# Patient Record
Sex: Female | Born: 1965 | Race: White | Hispanic: No | Marital: Married | Smoking: Never smoker
Health system: Southern US, Community
[De-identification: ages and names within clinical notes are randomized; demographics above are authoritative.]

## PROBLEM LIST (undated history)

## (undated) DIAGNOSIS — N870 Mild cervical dysplasia: Secondary | ICD-10-CM

## (undated) DIAGNOSIS — Z803 Family history of malignant neoplasm of breast: Secondary | ICD-10-CM

## (undated) DIAGNOSIS — N926 Irregular menstruation, unspecified: Secondary | ICD-10-CM

## (undated) DIAGNOSIS — D6851 Activated protein C resistance: Secondary | ICD-10-CM

## (undated) DIAGNOSIS — R896 Abnormal cytological findings in specimens from other organs, systems and tissues: Secondary | ICD-10-CM

## (undated) DIAGNOSIS — R102 Pelvic and perineal pain: Secondary | ICD-10-CM

## (undated) DIAGNOSIS — IMO0002 Reserved for concepts with insufficient information to code with codable children: Secondary | ICD-10-CM

## (undated) DIAGNOSIS — C50919 Malignant neoplasm of unspecified site of unspecified female breast: Secondary | ICD-10-CM

## (undated) DIAGNOSIS — R87619 Unspecified abnormal cytological findings in specimens from cervix uteri: Secondary | ICD-10-CM

## (undated) HISTORY — DX: Mild cervical dysplasia: N87.0

## (undated) HISTORY — DX: Unspecified abnormal cytological findings in specimens from cervix uteri: R87.619

## (undated) HISTORY — DX: Irregular menstruation, unspecified: N92.6

## (undated) HISTORY — DX: Pelvic and perineal pain: R10.2

## (undated) HISTORY — DX: Family history of malignant neoplasm of breast: Z80.3

## (undated) HISTORY — DX: Abnormal cytological findings in specimens from other organs, systems and tissues: R89.6

## (undated) HISTORY — DX: Reserved for concepts with insufficient information to code with codable children: IMO0002

## (undated) HISTORY — DX: Malignant neoplasm of unspecified site of unspecified female breast: C50.919

## (undated) HISTORY — DX: Activated protein C resistance: D68.51

---

## 1990-07-10 HISTORY — PX: LEEP: SHX91

## 1997-08-10 DIAGNOSIS — IMO0001 Reserved for inherently not codable concepts without codable children: Secondary | ICD-10-CM

## 1997-08-10 HISTORY — DX: Reserved for inherently not codable concepts without codable children: IMO0001

## 1998-03-18 ENCOUNTER — Ambulatory Visit (HOSPITAL_COMMUNITY): Admission: RE | Admit: 1998-03-18 | Discharge: 1998-03-18 | Payer: Self-pay | Admitting: Obstetrics and Gynecology

## 1998-04-12 ENCOUNTER — Inpatient Hospital Stay (HOSPITAL_COMMUNITY): Admission: AD | Admit: 1998-04-12 | Discharge: 1998-04-14 | Payer: Self-pay | Admitting: Obstetrics and Gynecology

## 1998-05-22 ENCOUNTER — Encounter (HOSPITAL_COMMUNITY): Admission: RE | Admit: 1998-05-22 | Discharge: 1998-06-28 | Payer: Self-pay | Admitting: Obstetrics and Gynecology

## 1998-05-26 ENCOUNTER — Inpatient Hospital Stay (HOSPITAL_COMMUNITY): Admission: AD | Admit: 1998-05-26 | Discharge: 1998-05-29 | Payer: Self-pay | Admitting: Obstetrics and Gynecology

## 1998-06-28 ENCOUNTER — Inpatient Hospital Stay (HOSPITAL_COMMUNITY): Admission: AD | Admit: 1998-06-28 | Discharge: 1998-07-07 | Payer: Self-pay | Admitting: Obstetrics and Gynecology

## 1998-07-07 ENCOUNTER — Encounter (HOSPITAL_COMMUNITY): Admission: RE | Admit: 1998-07-07 | Discharge: 1998-10-05 | Payer: Self-pay | Admitting: Obstetrics and Gynecology

## 1998-11-23 ENCOUNTER — Other Ambulatory Visit: Admission: RE | Admit: 1998-11-23 | Discharge: 1998-11-23 | Payer: Self-pay | Admitting: Obstetrics and Gynecology

## 1999-12-14 ENCOUNTER — Other Ambulatory Visit: Admission: RE | Admit: 1999-12-14 | Discharge: 1999-12-14 | Payer: Self-pay | Admitting: Obstetrics and Gynecology

## 2001-01-07 DIAGNOSIS — N926 Irregular menstruation, unspecified: Secondary | ICD-10-CM

## 2001-01-07 HISTORY — DX: Irregular menstruation, unspecified: N92.6

## 2001-01-23 ENCOUNTER — Other Ambulatory Visit: Admission: RE | Admit: 2001-01-23 | Discharge: 2001-01-23 | Payer: Self-pay | Admitting: Obstetrics and Gynecology

## 2001-02-05 ENCOUNTER — Ambulatory Visit (HOSPITAL_COMMUNITY): Admission: RE | Admit: 2001-02-05 | Discharge: 2001-02-05 | Payer: Self-pay | Admitting: Obstetrics and Gynecology

## 2001-02-05 ENCOUNTER — Encounter: Payer: Self-pay | Admitting: Obstetrics and Gynecology

## 2002-01-23 ENCOUNTER — Other Ambulatory Visit: Admission: RE | Admit: 2002-01-23 | Discharge: 2002-01-23 | Payer: Self-pay | Admitting: Obstetrics and Gynecology

## 2003-02-05 ENCOUNTER — Other Ambulatory Visit: Admission: RE | Admit: 2003-02-05 | Discharge: 2003-02-05 | Payer: Self-pay | Admitting: Obstetrics and Gynecology

## 2004-06-09 ENCOUNTER — Other Ambulatory Visit: Admission: RE | Admit: 2004-06-09 | Discharge: 2004-06-09 | Payer: Self-pay | Admitting: Obstetrics and Gynecology

## 2004-06-09 DIAGNOSIS — D6851 Activated protein C resistance: Secondary | ICD-10-CM

## 2004-06-09 HISTORY — DX: Activated protein C resistance: D68.51

## 2004-07-14 ENCOUNTER — Ambulatory Visit (HOSPITAL_COMMUNITY): Admission: RE | Admit: 2004-07-14 | Discharge: 2004-07-14 | Payer: Self-pay | Admitting: Obstetrics and Gynecology

## 2005-11-20 ENCOUNTER — Other Ambulatory Visit: Admission: RE | Admit: 2005-11-20 | Discharge: 2005-11-20 | Payer: Self-pay | Admitting: Obstetrics and Gynecology

## 2005-11-22 ENCOUNTER — Ambulatory Visit (HOSPITAL_COMMUNITY): Admission: RE | Admit: 2005-11-22 | Discharge: 2005-11-22 | Payer: Self-pay | Admitting: Obstetrics and Gynecology

## 2006-03-10 ENCOUNTER — Emergency Department (HOSPITAL_COMMUNITY): Admission: EM | Admit: 2006-03-10 | Discharge: 2006-03-10 | Payer: Self-pay | Admitting: Emergency Medicine

## 2006-04-19 ENCOUNTER — Ambulatory Visit (HOSPITAL_BASED_OUTPATIENT_CLINIC_OR_DEPARTMENT_OTHER): Admission: RE | Admit: 2006-04-19 | Discharge: 2006-04-20 | Payer: Self-pay | Admitting: Orthopedic Surgery

## 2006-07-10 HISTORY — PX: ARTHROSCOPIC REPAIR ACL: SUR80

## 2006-12-05 DIAGNOSIS — R102 Pelvic and perineal pain: Secondary | ICD-10-CM

## 2006-12-05 HISTORY — DX: Pelvic and perineal pain: R10.2

## 2007-07-02 ENCOUNTER — Ambulatory Visit (HOSPITAL_COMMUNITY): Admission: RE | Admit: 2007-07-02 | Discharge: 2007-07-02 | Payer: Self-pay | Admitting: Obstetrics and Gynecology

## 2008-09-04 ENCOUNTER — Ambulatory Visit (HOSPITAL_COMMUNITY): Admission: RE | Admit: 2008-09-04 | Discharge: 2008-09-04 | Payer: Self-pay | Admitting: Obstetrics and Gynecology

## 2009-10-04 ENCOUNTER — Ambulatory Visit (HOSPITAL_COMMUNITY): Admission: RE | Admit: 2009-10-04 | Discharge: 2009-10-04 | Payer: Self-pay | Admitting: Obstetrics and Gynecology

## 2009-10-14 DIAGNOSIS — N870 Mild cervical dysplasia: Secondary | ICD-10-CM

## 2009-10-14 HISTORY — DX: Mild cervical dysplasia: N87.0

## 2010-11-17 DIAGNOSIS — N926 Irregular menstruation, unspecified: Secondary | ICD-10-CM

## 2010-11-17 HISTORY — DX: Irregular menstruation, unspecified: N92.6

## 2010-11-25 NOTE — Op Note (Signed)
Anita Frazier, Anita Frazier                 ACCOUNT NO.:  [id number]   MEDICAL RECORD NO.:  [id number]          PATIENT TYPE:  AMB   LOCATION:  DSC                          FACILITY:  [facility]   PHYSICIAN:  Loreta Ave, M.D. DATE OF BIRTH:  [birth year]   DATE OF PROCEDURE:  04/19/2006  DATE OF DISCHARGE:                                 OPERATIVE REPORT   PREOPERATIVE DIAGNOSIS:  Left knee anterior cruciate ligament tear with  anterolateral rotary instability.   POSTOPERATIVE DIAGNOSES:  Left knee anterior cruciate ligament tear with  anterolateral rotary instability, with focal mild grade 3 impaction  injuries, anterior aspect of medial and lateral femoral condyles, and radial  tear of midportion, lateral meniscus.   PROCEDURES:  Left knee examination under anesthesia, arthroscopy with  chondroplasty of both condyles, partial lateral meniscectomy,  arthroscopic/endoscopic anterior cruciate ligament reconstruction with  patellar tendon allograft, bone-tendon-bone, with bioabsorbable screw  fixation and notchplasty.   SURGEON:  Loreta Ave, M.D.   ASSISTANT:  Genene Churn. Denton Meek., present throughout the entire case.   SPECIMENS:  None.   CULTURES:  None.   COMPLICATIONS:  None.   DRESSING:  Soft compressive with knee immobilizer.   DESCRIPTION OF PROCEDURES:  The patient was brought into the operating room,  and after adequate anesthesia had been obtained, the left knee examined.  Positive Lachman, positive drawer, positive pivot shift, although the leg  had been stable with full motion.  Tourniquet applied.  Prepped and draped  in the usual sterile fashion.  Three portals created, 1 superolateral, 1  each medial and lateral parapatellar.  Inflow catheter introduced.  Knee  distended.  Arthroscope introduced.  Knee inspected.  Patellofemoral joint  looked good.  Small 5 to 6-mm partial-thickness grade 3 chondral impaction  injuries, with some chondral fragmentation anteriorly  on both the medial and  lateral femoral condyles.  Chondroplasty to a stable surface.  Contained  lesions.  Remaining articular cartilage looked good.  Radial tear, medial  portion of lateral meniscus saucerized out.  Medial meniscus intact.  Complete midsubstance irreparable ACL tear.  Moderately narrow notch.  ACL  debrided.  Notch opened with notchplasty with shaver and high-speed bur.  Allograft prepared for 9-mm tunnels, bone-tendon-bone.  Tibial guide seated  on the footprint of the ACL on the tibia, the other end through a small  vertical incision medial to the tubercle.  K wire driven and found to be in  good place, then overdrilled with a 9-mm reamer.  It could be cleared with a  shaver.  The femoral guide was inserted across the tibial tunnel and notched  in the cortex of the femur.  K wire driven and overdrilled with a 9-mm  reamer to appropriate depth for the graft and pegs.  Debris cleared  throughout.  Tested and found to be in good position.  Two-pin passer  inserted across both tunnels and out through a stab wound in the nonetheless  thigh.  Nitinol wire brought in the medial pole and up to the femoral  tunnel.  Graft attached to 2-pin passer, pulled  in across the knee.  Both  pegs were then firmly fixed with isometric appositioning and tensioning  technique, utilizing 8 x 25 bioabsorbable screws above and below.  At  completion, Nitinol wire and sutures removed.  Excellent stability.  Full  motion and no tension on the graft, and no impingement through full motion,  when viewed arthroscopically.  Negative Lachman and drawer.  The entire knee  examined and no other findings appreciated.  The wound was irrigated.  Portals were closed with nylon.  The incisions were closed with  subcutaneous, subcuticular Vicryl.  Sterile compressive dressing applied.  Knee immobilizer applied.  Anesthesia reversed. Brought to the recovery  room.  Tolerated the surgery well with no  complications.      Loreta Ave, M.D.  Electronically Signed     DFM/MEDQ  D:  04/19/2006  T:  04/19/2006  Job:  [id number]

## 2011-01-06 ENCOUNTER — Other Ambulatory Visit (HOSPITAL_COMMUNITY): Payer: Self-pay | Admitting: Obstetrics and Gynecology

## 2011-01-06 DIAGNOSIS — Z1231 Encounter for screening mammogram for malignant neoplasm of breast: Secondary | ICD-10-CM

## 2011-01-18 ENCOUNTER — Ambulatory Visit (HOSPITAL_COMMUNITY)
Admission: RE | Admit: 2011-01-18 | Discharge: 2011-01-18 | Disposition: A | Discharge status: Home / Self Care | Payer: BC Managed Care – PPO | Source: Ambulatory Visit | Attending: Obstetrics and Gynecology | Admitting: Obstetrics and Gynecology

## 2011-01-18 DIAGNOSIS — Z1231 Encounter for screening mammogram for malignant neoplasm of breast: Secondary | ICD-10-CM | POA: Insufficient documentation

## 2011-11-21 DIAGNOSIS — D6851 Activated protein C resistance: Secondary | ICD-10-CM | POA: Insufficient documentation

## 2011-11-21 DIAGNOSIS — N870 Mild cervical dysplasia: Secondary | ICD-10-CM | POA: Insufficient documentation

## 2011-11-30 ENCOUNTER — Ambulatory Visit: Payer: Self-pay | Admitting: Obstetrics and Gynecology

## 2012-02-05 ENCOUNTER — Other Ambulatory Visit: Payer: Self-pay | Admitting: Obstetrics and Gynecology

## 2012-02-05 DIAGNOSIS — Z1231 Encounter for screening mammogram for malignant neoplasm of breast: Secondary | ICD-10-CM

## 2012-02-22 ENCOUNTER — Ambulatory Visit (HOSPITAL_COMMUNITY)
Admission: RE | Admit: 2012-02-22 | Discharge: 2012-02-22 | Disposition: A | Discharge status: Home / Self Care | Payer: 59 | Source: Ambulatory Visit | Attending: Obstetrics and Gynecology | Admitting: Obstetrics and Gynecology

## 2012-02-22 DIAGNOSIS — Z1231 Encounter for screening mammogram for malignant neoplasm of breast: Secondary | ICD-10-CM | POA: Insufficient documentation

## 2012-02-23 NOTE — Progress Notes (Signed)
Quick Note:  Please send "Dense breast" letter to patient and document in chart when letter is sent. ______ 

## 2012-02-26 ENCOUNTER — Encounter: Payer: Self-pay | Admitting: Obstetrics and Gynecology

## 2013-07-31 ENCOUNTER — Other Ambulatory Visit: Payer: Self-pay | Admitting: Obstetrics and Gynecology

## 2013-07-31 DIAGNOSIS — Z1231 Encounter for screening mammogram for malignant neoplasm of breast: Secondary | ICD-10-CM

## 2013-08-11 ENCOUNTER — Ambulatory Visit (HOSPITAL_COMMUNITY)
Admission: RE | Admit: 2013-08-11 | Discharge: 2013-08-11 | Disposition: A | Discharge status: Home / Self Care | Payer: 59 | Source: Ambulatory Visit | Attending: Obstetrics and Gynecology | Admitting: Obstetrics and Gynecology

## 2013-08-11 DIAGNOSIS — Z1231 Encounter for screening mammogram for malignant neoplasm of breast: Secondary | ICD-10-CM | POA: Insufficient documentation

## 2013-08-13 ENCOUNTER — Other Ambulatory Visit: Payer: Self-pay | Admitting: Obstetrics and Gynecology

## 2013-08-13 DIAGNOSIS — R928 Other abnormal and inconclusive findings on diagnostic imaging of breast: Secondary | ICD-10-CM

## 2013-08-18 ENCOUNTER — Other Ambulatory Visit: Payer: Self-pay | Admitting: Obstetrics and Gynecology

## 2013-08-18 ENCOUNTER — Other Ambulatory Visit: Payer: Self-pay

## 2013-08-18 DIAGNOSIS — R928 Other abnormal and inconclusive findings on diagnostic imaging of breast: Secondary | ICD-10-CM

## 2013-08-22 ENCOUNTER — Ambulatory Visit
Admission: RE | Admit: 2013-08-22 | Discharge: 2013-08-22 | Disposition: A | Discharge status: Home / Self Care | Payer: 59 | Source: Ambulatory Visit | Attending: Obstetrics and Gynecology | Admitting: Obstetrics and Gynecology

## 2013-08-22 ENCOUNTER — Ambulatory Visit
Admission: RE | Admit: 2013-08-22 | Discharge: 2013-08-22 | Disposition: A | Discharge status: Home / Self Care | Payer: BC Managed Care – PPO | Source: Ambulatory Visit | Attending: Obstetrics and Gynecology | Admitting: Obstetrics and Gynecology

## 2013-08-22 DIAGNOSIS — R928 Other abnormal and inconclusive findings on diagnostic imaging of breast: Secondary | ICD-10-CM

## 2016-12-06 DIAGNOSIS — Z1231 Encounter for screening mammogram for malignant neoplasm of breast: Secondary | ICD-10-CM | POA: Diagnosis not present

## 2016-12-06 DIAGNOSIS — Z124 Encounter for screening for malignant neoplasm of cervix: Secondary | ICD-10-CM | POA: Diagnosis not present

## 2016-12-06 DIAGNOSIS — Z01419 Encounter for gynecological examination (general) (routine) without abnormal findings: Secondary | ICD-10-CM | POA: Diagnosis not present

## 2016-12-06 DIAGNOSIS — R928 Other abnormal and inconclusive findings on diagnostic imaging of breast: Secondary | ICD-10-CM | POA: Diagnosis not present

## 2016-12-06 DIAGNOSIS — Z6821 Body mass index (BMI) 21.0-21.9, adult: Secondary | ICD-10-CM | POA: Diagnosis not present

## 2016-12-07 ENCOUNTER — Other Ambulatory Visit: Payer: Self-pay | Admitting: Obstetrics and Gynecology

## 2016-12-07 DIAGNOSIS — R922 Inconclusive mammogram: Secondary | ICD-10-CM | POA: Diagnosis not present

## 2016-12-07 DIAGNOSIS — R928 Other abnormal and inconclusive findings on diagnostic imaging of breast: Secondary | ICD-10-CM

## 2016-12-11 ENCOUNTER — Other Ambulatory Visit: Payer: Self-pay | Admitting: Radiology

## 2016-12-11 DIAGNOSIS — D0512 Intraductal carcinoma in situ of left breast: Secondary | ICD-10-CM | POA: Diagnosis not present

## 2016-12-11 DIAGNOSIS — N6320 Unspecified lump in the left breast, unspecified quadrant: Secondary | ICD-10-CM | POA: Diagnosis not present

## 2016-12-11 DIAGNOSIS — C50412 Malignant neoplasm of upper-outer quadrant of left female breast: Secondary | ICD-10-CM | POA: Diagnosis not present

## 2016-12-13 ENCOUNTER — Telehealth: Payer: Self-pay | Admitting: *Deleted

## 2016-12-13 NOTE — Telephone Encounter (Signed)
Pt called to change [facility] appt. Relate her children are graduating on 6/13. Informed pt she will be referred to see a surgeon and will then come to see med  onc and rad onc. Received verbal understanding.

## 2016-12-14 ENCOUNTER — Ambulatory Visit: Payer: Self-pay | Admitting: General Surgery

## 2016-12-14 DIAGNOSIS — D0512 Intraductal carcinoma in situ of left breast: Secondary | ICD-10-CM | POA: Diagnosis not present

## 2016-12-15 ENCOUNTER — Encounter: Payer: Self-pay | Admitting: Genetics

## 2016-12-20 ENCOUNTER — Other Ambulatory Visit: Payer: Self-pay | Admitting: General Surgery

## 2016-12-20 ENCOUNTER — Encounter: Payer: Self-pay | Admitting: Radiation Oncology

## 2016-12-20 DIAGNOSIS — D0512 Intraductal carcinoma in situ of left breast: Secondary | ICD-10-CM

## 2016-12-20 NOTE — Progress Notes (Signed)
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   Past/Anticipated interventions by surgeon, if any: Dr. Marlou Starks recommending Mastectomy.   Past/Anticipated interventions by medical oncology, if any:  Dr. Jana Hakim 01/08/17  Lymphedema issues, if any:  N/A  Pain issues, if any:  She denies.   SAFETY ISSUES:  Prior radiation? No  Pacemaker/ICD? No  Possible current pregnancy? No  Is the patient on methotrexate? No  Current Complaints / other details:   MRI bilateral Breasts 01/08/17  BP (!) 142/86   Pulse 67   Temp 98.2 F (36.8 C)   Ht 5' (1.524 m)   Wt 112 lb 6.4 oz (51 kg)   LMP 02/14/2012   SpO2 100% Comment: room air  BMI 21.95 kg/m    Wt Readings from Last 3 Encounters:  12/27/16 112 lb 6.4 oz (51 kg)      Pilar Westergaard, Stephani Police, RN 12/20/2016,2:16 PM

## 2016-12-22 ENCOUNTER — Encounter: Payer: Self-pay | Admitting: Oncology

## 2016-12-27 ENCOUNTER — Encounter: Payer: Self-pay | Admitting: Radiation Oncology

## 2016-12-27 ENCOUNTER — Ambulatory Visit
Admission: RE | Admit: 2016-12-27 | Discharge: 2016-12-27 | Disposition: A | Discharge status: Home / Self Care | Payer: 59 | Source: Ambulatory Visit | Attending: Radiation Oncology | Admitting: Radiation Oncology

## 2016-12-27 VITALS — BP 142/86 | HR 67 | Temp 98.2°F | Ht 60.0 in | Wt 112.4 lb

## 2016-12-27 DIAGNOSIS — D0512 Intraductal carcinoma in situ of left breast: Secondary | ICD-10-CM | POA: Diagnosis not present

## 2016-12-27 DIAGNOSIS — C50912 Malignant neoplasm of unspecified site of left female breast: Secondary | ICD-10-CM

## 2016-12-27 DIAGNOSIS — C50412 Malignant neoplasm of upper-outer quadrant of left female breast: Secondary | ICD-10-CM

## 2016-12-27 DIAGNOSIS — Z803 Family history of malignant neoplasm of breast: Secondary | ICD-10-CM | POA: Diagnosis not present

## 2016-12-27 DIAGNOSIS — Z171 Estrogen receptor negative status [ER-]: Principal | ICD-10-CM

## 2016-12-27 NOTE — Progress Notes (Signed)
Radiation Oncology         [phone] ________________________________  Initial Outpatient:[id number]} Consultation  Name: Anita Frazier MRN: [id number]  Date: 12/27/2016  DOB: [birth year]  MH:[id number], Pcp Not In  Jovita Kussmaul, MD   REFERRING PHYSICIAN: Autumn Messing III, MD  DIAGNOSIS:    ICD-10-CM   1. Malignant neoplasm of left female breast, unspecified estrogen receptor status, unspecified site of breast [facility]) C50.912 Ambulatory referral to Social Work  2. Carcinoma of upper-outer quadrant of left breast in female, estrogen receptor negative ([place]) C50.412    Z17.1   Cancer Staging Carcinoma of upper-outer quadrant of left breast in female, estrogen receptor negative ([place]) Staging form: Breast, AJCC 8th Edition - Clinical: Stage IA (cT56m, cN0, cM0, G3, ER: Negative, PR: Negative, HER2: Positive) - Signed by SEppie Gibson MD on 12/27/2016   CHIEF COMPLAINT: Here to discuss management of left breast cancer  HISTORY OF PRESENT ILLNESS::Anita C BWingleris a 51y.o. female who presented with abnormal left breast mammogram, [facility] revealed a 4.3 cm region of pleomorphic calcifications in the left breast UOQ. [place]on 12/11[place]16 revealed a left breast mass measuring about 7 cm, left axilla is negative and MR of the breast is pending. Biopsy of the left breast on 12/11/16 high grade DCIS with necrosis, grade 3, highly suspicion for invasion. This was ER negative PR negative, HER2 positive. She has plans for a mastectomy with Dr. TMarlou Starksand sees medical oncology on January 08, 2017. Two maternal aunts had breast cancer and one was in her 421s She anticipates reconstruction.  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    PAST SURGICAL HISTORY: Past Surgical History:  Procedure Laterality Date  . ARTHROSCOPIC REPAIR ACL Left 2008  . LEEP  1992    FAMILY HISTORY: family history includes COPD in her son; Cancer (age of onset: 417 in her maternal aunt; Diabetes in her maternal grandmother; Heart disease (age of onset: 646 in her father; Hypertension in her mother.  SOCIAL HISTORY:  reports that she has never smoked. She has never used smokeless tobacco. She reports that she drinks alcohol. She reports that she does not use drugs. 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  ALLERGIES: Patient has no known allergies.  MEDICATIONS:  No current outpatient prescriptions on file.   No current facility-administered medications for this encounter.     REVIEW OF SYSTEMS: as above  PHYSICAL EXAM:  height is 5' (1.524 m) and weight is 112 lb 6.4 oz (51 kg). Her temperature is 98.2 F (36.8 C). Her blood pressure is 142/86 (abnormal) and her pulse is 67. Her oxygen saturation is 100%.   General: Alert and oriented, in no acute distress HEENT: Head is normocephalic. Extraocular movements are intact. Oropharynx is clear. Neck: Neck is supple, no palpable cervical or supraclavicular lymphadenopathy, no masses. Heart: Regular in rate and rhythm with no murmurs, rubs, or gallops. Chest: Clear to auscultation bilaterally, with no rhonchi, wheezes, or rales. Abdomen: Soft, nontender, nondistended, with no rigidity or guarding. Extremities: No cyanosis or edema. Lymphatics: see Neck Exam Skin: No concerning lesions. Musculoskeletal:  symmetric strength and muscle tone throughout. Neurologic: Cranial nerves II through XII are grossly intact. No obvious focalities. Speech is fluent. Coordination is intact. Psychiatric: Judgment and insight are intact. Affect is  appropriate. Left Breasts: 3 o'clock area of firmness spands about 3.5 cm, this may be in part post biopsy. No other palpable masses appreciated in the breasts or axillae.   ECOG = 0  0 - Asymptomatic (Fully active, able to carry on all predisease activities without restriction)  1 - Symptomatic but completely ambulatory (Restricted in physically strenuous activity but ambulatory and able to carry out work of a light or sedentary nature. For example, light housework, office work)  2 - Symptomatic, <50% in bed during the day (Ambulatory and capable of all self care but unable to carry out any work activities. Up and about more than 50% of waking hours)  3 - Symptomatic, >50% in bed, but not bedbound (Capable of only limited self-care, confined to bed or chair 50% or more of waking hours)  4 - Bedbound (Completely disabled. Cannot carry on any self-care. Totally confined to bed or chair)  5 - Death   Eustace Pen MM, Creech RH, Tormey DC, et al. [phone]). "Toxicity and response criteria of the [facility]". [place] Oncol. 5 (6): 649-55   LABORATORY DATA:  No results found for: WBC, HGB, HCT, MCV, PLT CMP  No results found for: NA, K, CL, CO2, GLUCOSE, BUN, CREATININE, CALCIUM, PROT, ALBUMIN, AST, ALT, ALKPHOS, BILITOT, GFRNONAA, GFRAA       RADIOGRAPHY: as above     IMPRESSION/PLAN: Left breast cancer  Anticipates mastectomy.  Med/onc consult is pending. I explained further information may be needed re: extent of invasive disease to determine role of radiotherapy and chemotherapy. We will review final pathology at tumor board.  It was a pleasure meeting the patient today. We discussed the risks, benefits, and side effects of radiotherapy.  We discussed that radiation could take approximately 5-6 weeks to complete and that I would give the patient a few weeks to heal following surgery before starting treatment planning, if RT is given. If chemotherapy were to be  given, this would precede radiotherapy. We spoke about acute effects including skin irritation and fatigue as well as much less common late effects including internal organ injury or irritation. We spoke about the latest technology that is used to minimize the risk of late effects for patients undergoing radiotherapy to the breast or chest wall. No guarantees of treatment were given. The patient is enthusiastic about proceeding with treatment. I look forward to participating in the patient's care.  I will await her referral back to me for postoperative follow-up and eventual CT simulation/treatment planning. The patient signed a consent form to begin radiotherapy if needed.  She does anticipate reconstructive surgery, and we discussed the potential implications of radiotherapy in this setting.  I recommend genetic counseling and will make referral.   I spent 45 minutes  face to face with the patient and more than 50% of that time was spent in counseling and/or coordination of care.   __________________________________________   Eppie Gibson, MD  This document serves as a record of services personally performed by Eppie Gibson, MD. It was created on her behalf by Valeta Harms, a trained medical scribe. The creation of this record is based on the scribe's personal observations and the provider's statements to them. This document has been checked and approved by the attending provider.

## 2016-12-28 DIAGNOSIS — Z171 Estrogen receptor negative status [ER-]: Secondary | ICD-10-CM | POA: Diagnosis not present

## 2016-12-28 DIAGNOSIS — C50412 Malignant neoplasm of upper-outer quadrant of left female breast: Secondary | ICD-10-CM | POA: Diagnosis not present

## 2016-12-29 ENCOUNTER — Encounter: Payer: Self-pay | Admitting: *Deleted

## 2016-12-29 NOTE — Progress Notes (Signed)
[place] Psychosocial Distress Screening Clinical Social Work  Clinical Social Work was referred by distress screening protocol.  The patient scored a 6 on the Psychosocial Distress Thermometer which indicates moderate distress. Clinical Social Worker reviewed chart and phoned pt to assess for distress and other psychosocial needs.  CSW introduced self, explained role of CSW/Pt and Family Support Team, support groups and other resources to assist. 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 She returns to work Aug. 20. She reports to have three children a 47yo son and 57 yo twins (boy and girl) that head off to college in the fall. 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 CSW provided supportive listening and education on common emotions and coping. Pt is eager to get her treatment plan started and move forward. CSW is mailing pt additional resource information. Pt is eager for her upcoming week at the beach and will reach out to [place] team as needed.    ONCBCN DISTRESS SCREENING 12/27/2016  Screening Type Initial Screening  Distress experienced in past week (1-10) 6  Practical problem type Work/school  Family Problem type Children  Emotional problem type Nervousness/Anxiety;Adjusting to illness;Adjusting to appearance changes  Information Concerns Type Lack of info about treatment;Lack of info about maintaining fitness  Physical Problem type Sleep/insomnia    Clinical Social Worker follow up needed: Yes.    If yes, follow up plan:  See above Loren Racer, LCSW, OSW-C Clinical Social Worker [place]  [facility] Phone: [phone] Fax: [phone]

## 2017-01-05 ENCOUNTER — Other Ambulatory Visit: Payer: Self-pay

## 2017-01-05 DIAGNOSIS — Z171 Estrogen receptor negative status [ER-]: Principal | ICD-10-CM

## 2017-01-05 DIAGNOSIS — C50412 Malignant neoplasm of upper-outer quadrant of left female breast: Secondary | ICD-10-CM

## 2017-01-07 NOTE — Progress Notes (Signed)
Anita Frazier  Telephone:([phone]) [phone] Fax:(336) [phone]     ID: KINZA GOUVEIA DOB: 1965-09-21  MR#: [id number]  JSH#:[id number]  Patient Care Team: Delsa Bern, MD as PCP - General (Obstetrics and Gynecology) Brunella Wileman, Virgie Dad, MD as Consulting Physician (Oncology) Jovita Kussmaul, MD as Consulting Physician (General Surgery) Eppie Gibson, MD as Attending Physician (Radiation Oncology) Irene Limbo, MD as Consulting Physician (Plastic Surgery) Chauncey Cruel, MD OTHER MD:  CHIEF COMPLAINT: Estrogen receptor negative breast cancer  CURRENT TREATMENT: Awaiting definitive surgery   BREAST CANCER HISTORY: Ayde had screening mammography 12/06/2016 at Dr Boyd Kerbs suggesting a possible abnormality in the left breast upper outer quadrant. Left diagnostic mammography at [facility] 12/07/2016 found the breast density to be category D. In the upper-outer quadrant of the left breast there was 4.3 cm area of pleomorphic calcifications. Left breast ultrasonography 12/11/2016 found a mass in the area in question measuring 4.5 cm. The left axilla was sonographically benign.  Biopsy of the left breast upper outer quadrant mass 12/11/2016 showed ([id number]) ductal carcinoma in situ, high-grade, with areas highly suspicious for invasion. There are 2 prognstic panels, both estrogen and progesterone receptor negative, with MIB-1 between 10 and 15%. The tumor was HER-2 amplified, with a signals ratio 4.25 and the number per cell 9.35.  The patient's subsequent history is as detailed below.  INTERVAL HISTORY: Anita Frazier was evaluated in the breast clinic 01/08/2017 accompanied by her sister Leig-Ann. Her case was also presented in the multidisciplinary breast cancer conference 12/20/2016. At that time a preliminary plan was proposed: Mastectomy, with sentinel lymph node sampling and genetics testing  REVIEW OF SYSTEMS: There were no specific symptoms leading to the original mammogram,  which was routinely scheduled. The patient denies unusual headaches, visual changes, nausea, vomiting, stiff neck, dizziness, or gait imbalance. There has been no cough, phlegm production, or pleurisy, no chest pain or pressure, and no change in bowel or bladder habits. The patient denies fever, rash, bleeding, unexplained fatigue or unexplained weight loss. A detailed review of systems was otherwise entirely negative.   PAST MEDICAL HISTORY: Past Medical History:  Diagnosis Date  . Abnormal Pap smear 1992 & 3/99   LEEP  . ASCUS (atypical squamous cells of undetermined significance) on Pap smear 08/1997  . 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 Dysplasia of cervix, low grade (CIN 1)   . Factor 5 Leiden mutation, heterozygous ([place]) 06/09/04  . Irregular menses 01/2001  . Menstrual period late 11/17/10  . Pelvic pain 12/05/06    PAST SURGICAL HISTORY: Past Surgical History:  Procedure Laterality Date  . ARTHROSCOPIC REPAIR ACL Left 2008  . LEEP  1992    FAMILY HISTORY Family History  Problem Relation Age of Onset  . Heart disease Father 39       MI  . Cancer Maternal Aunt 42       breast  . Hypertension Mother        chronic  . COPD Son        Respiratory affective disease   . Diabetes Maternal Grandmother   The patient's parents are living as of June 2018, her father being 62 and her mother 20. The patient's mother is 4 sisters, 2 of whom had breast cancer, one at age 71, one at age 60. The patient had no brothers, 1 sister. There is no history of ovarian cancer in the family.  GYNECOLOGIC HISTORY:  Patient's last menstrual period was 02/14/2012. Menarche age 65, first live birth  age 60, she is GX P3, including a per of twins). She stopped having periods approximately 2015. She did not use hormone replacement. She used oral contraceptives for 2 years remotely with no complications but no history of factor V Leiden  SOCIAL HISTORY:  Carmesha works as an  Nurse, children's. Her husband, Palma Holter who works in [employer] for [employer]. Their children are:, 20, studying business at [employer] and still at home, and the twins [place] and [place] Ma , 18, who will be freshman at [employer] August 2018. The patient attends a local [place]:  in place. The patient's husband is her healthcare part of attorney    HEALTH MAINTENANCE: Social History  Substance Use Topics  . Smoking status: Never Smoker  . Smokeless tobacco: Never Used  . Alcohol use Yes     Comment: 2-3 drinks weekly     Colonoscopy:Never  PAP:  Bone density:   No Known Allergies  No current outpatient prescriptions on file.   No current facility-administered medications for this visit.     OBJECTIVE:Middle-aged white woman who appears well   Vitals:   01/08/17 1622  BP: 136/67  Pulse: 68  Resp: 18  Temp: 98 F (36.7 C)     Body mass index is 21.89 kg/m.  Filed Weights   01/08/17 1622  Weight: 112 lb 1.6 oz (50.8 kg)      ECOG FS:0 - Asymptomatic  Ocular: Sclerae unicteric, pupils round and equal Ear-nose-throat: Oropharynx clear and moist Lymphatic: No cervical or supraclavicular adenopathy Lungs no rales or rhonchi Heart regular rate and rhythm Abd soft, nontender, positive bowel sounds MSK no focal spinal tenderness, no joint edema Neuro: non-focal, well-oriented, appropriate affect Breasts: I do not palpate a mass in either breast. There are no skin or nipple changes of concern. Both axillae are benign.   LAB RESULTS:  CMP     Component Value Date/Time   NA 143 01/08/2017 1524   K 4.1 01/08/2017 1524   CO2 28 01/08/2017 1524   GLUCOSE 99 01/08/2017 1524   BUN 14.5 01/08/2017 1524   CREATININE 0.8 01/08/2017 1524   CALCIUM 10.3 01/08/2017 1524   PROT 7.1 01/08/2017 1524   ALBUMIN 4.1 01/08/2017 1524   AST 20 01/08/2017 1524   ALT 17 01/08/2017 1524   ALKPHOS 42 01/08/2017 1524   BILITOT 0.97 01/08/2017 1524      No results found for: TOTALPROTELP, ALBUMINELP, A1GS, A2GS, BETS, BETA2SER, GAMS, MSPIKE, SPEI  No results found for: Nils Pyle, [facility]  Lab Results  Component Value Date   WBC 7.7 01/08/2017   NEUTROABS 5.7 01/08/2017   HGB 14.3 01/08/2017   HCT 42.3 01/08/2017   MCV 95.4 01/08/2017   PLT 185 01/08/2017      Chemistry      Component Value Date/Time   NA 143 01/08/2017 1524   K 4.1 01/08/2017 1524   CO2 28 01/08/2017 1524   BUN 14.5 01/08/2017 1524   CREATININE 0.8 01/08/2017 1524      Component Value Date/Time   CALCIUM 10.3 01/08/2017 1524   ALKPHOS 42 01/08/2017 1524   AST 20 01/08/2017 1524   ALT 17 01/08/2017 1524   BILITOT 0.97 01/08/2017 1524       No results found for: LABCA2  No components found for: [id number]  No results for input(s): INR in the last 168 hours.  Urinalysis No results found for: COLORURINE, APPEARANCEUR, [place], [place], [place], [place], [place], [place], [place],  UROBILINOGEN, NITRITE, LEUKOCYTESUR   STUDIES: Mr Breast Bilateral W Wo Contrast  Result Date: 01/08/2017 CLINICAL DATA:  Biopsy-proven high grade DCIS within the left breast, with necrosis and suspicion for invasion, via recent stereotactic guided biopsy of pleomorphic calcifications in the left breast which measured greater than 5 cm greatest extent. Family history of breast cancer, including aunt in her 72s. LABS:  Double EXAM: BILATERAL BREAST MRI WITH AND WITHOUT CONTRAST TECHNIQUE: Multiplanar, multisequence MR images of both breasts were obtained prior to and following the intravenous administration of 12 ml of MultiHance. THREE-DIMENSIONAL MR IMAGE RENDERING ON INDEPENDENT WORKSTATION: Three-dimensional MR images were rendered by post-processing of the original MR data on an independent workstation. The three-dimensional MR images were interpreted, and findings are reported in the following complete MRI report for this study. Three dimensional  images were evaluated at the independent DynaCad workstation COMPARISON:  Previous exam(s). FINDINGS: Breast composition: c. Heterogeneous fibroglandular tissue. Background parenchymal enhancement: Moderate. Right breast: No suspicious enhancing mass, non-mass enhancement or secondary signs of malignancy within the right breast. Left breast: Extensive non-mass enhancement within the outer left breast, lower outer quadrant, from anterior to posterior depth, measuring approximately 7 cm AP dimension and 3 cm transverse dimension, corresponding to the extent of suspicious calcifications seen on mammogram. Biopsy clip artifact is seen at the anterior aspect of the non-mass enhancement corresponding to the site of recent biopsy-proven DCIS. Lymph nodes: No abnormal appearing lymph nodes. Ancillary findings:  None. IMPRESSION: 1. Biopsy-proven DCIS (with suspicion for invasion) within the lower outer quadrant of the LEFT breast, 4 o'clock axis region, at anterior depth, with associated biopsy clip artifact. The associated biopsy clip is positioned at the anterior aspect of extensive suspicious non-mass enhancement which extends throughout the lower outer quadrant, from anterior to posterior depth, measuring approximately 7 cm extent. This extensive non-mass enhancement within the left breast corresponds to the extent of suspicious pleomorphic calcifications seen on mammogram. 2. No evidence of malignancy within the RIGHT breast. 3. No evidence of metastatic lymphadenopathy. RECOMMENDATION: 1. Per current treatment plan for patient's known left breast cancer. 2. If breast conservation therapy was considered for the left breast, additional stereotactic-guided biopsy of the most posterior calcifications (or MRI-guided biopsy of the most posterior portion of the non-mass enhancement) would be recommended to confirm extent of disease. Per the provided clinical data, however, current plan is for a left mastectomy. BI-RADS  CATEGORY  6: Known biopsy-proven malignancy. Electronically Signed   By: Franki Cabot M.D.   On: 01/08/2017 16:16    ELIGIBLE FOR AVAILABLE RESEARCH PROTOCOL: no  ASSESSMENT: 51 y.o. [place] woman status post left breast upper outer quadrant biopsy 12/11/2016 for ductal carcinoma in situ, high-grade, with areas concerning for invasion, estrogen and progesterone receptor negative, HER-2 amplified, with an MIB-1 in the 10-15% range  (1) genetics testing pending  (2) definitive surgery to follow  (3) consider chemotherapy and or anti-HER-2 immunotherapy depending on final surgical results  (4) adjuvant radiation if appropriate  PLAN: We spent the better part of today's hour-long appointment discussing the biology of breast cancer in general, and the specifics of the patient's tumor in particular. We first reviewed the fact that cancer is not one disease but more than 100 different diseases and that it is important to keep them separate-- otherwise when friends and relatives discuss their own cancer experiences with Mariadejesus confusion can result. Similarly we explained that if breast cancer spreads to the bone or liver, the patient would not have bone  cancer or liver cancer, but breast cancer in the bone and breast cancer in the liver: one cancer in three places-- not 3 different cancers which otherwise would have to be treated in 3 different ways.  We discussed the difference between local and systemic therapy. In terms of loco-regional treatment, lumpectomy plus radiation is equivalent to mastectomy as far as survival is concerned. In her case however we really do not see a viable option for breast conservation. Particularly in view of the MRI, which we reviewed in detail today, the plan will be for left mastectomy with sentinel lymph node sampling.  Because Pauline does qualify for genetics testing we discussed her options there. In patients who carry a deleterious mutation [for example in a   BRCA gene], the risk of a new breast cancer developing in the future may be sufficiently great that the patient may choose bilateral mastectomies. However if she wishes to keep her breasts in that situation it is safe to do so. That would require intensified screening, which generally means not only yearly mammography but a yearly breast MRI as well.   Of course, if there is a deleterious mutation bilateral oophorectomy would be necessary as there is no standard screening protocol for ovarian cancer.  We then discussed the rationale for systemic therapy. Refer tumor proves to be all in situ, and she has a mastectomy with clear margins, then she is pretty much done with this cancer. However if there is invasive disease, and if the invasive disease as the same profile as her DCIS, namely HER-2 positive, then we would consider chemotherapy and anti-HER-2 immunotherapy depending on the size of the tumor and whether lymph nodes are involved.  The plan then is to start with genetics testing, which we are expediting. If she does carry a deleterious mutation she is fairly sure she would want bilateral mastectomies. I think this is very reasonable given her age. She is considering implant reconstruction. She has already discussed this with plastics.  Accordingly she will see me in approximately 6 weeks. We should have the final results of her surgery on hand by then and can make a decision regarding what if any systemic therapy she may require.  Sanjana has a good understanding of the overall plan. She agrees with it. She knows the goal of treatment in her case is cure. She will call with any problems that may develop before her next visit here.  Chauncey Cruel, MD   01/09/2017 7:59 PM Medical Oncology and Hematology [facility] [address] [place], [place] [address] Tel. [phone]    Fax. [phone]

## 2017-01-08 ENCOUNTER — Ambulatory Visit (HOSPITAL_BASED_OUTPATIENT_CLINIC_OR_DEPARTMENT_OTHER): Payer: 59 | Admitting: Oncology

## 2017-01-08 ENCOUNTER — Encounter: Payer: Self-pay | Admitting: *Deleted

## 2017-01-08 ENCOUNTER — Ambulatory Visit
Admission: RE | Admit: 2017-01-08 | Discharge: 2017-01-08 | Disposition: A | Discharge status: Home / Self Care | Payer: 59 | Source: Ambulatory Visit | Attending: General Surgery | Admitting: General Surgery

## 2017-01-08 ENCOUNTER — Other Ambulatory Visit (HOSPITAL_BASED_OUTPATIENT_CLINIC_OR_DEPARTMENT_OTHER): Payer: 59

## 2017-01-08 VITALS — BP 136/67 | HR 68 | Temp 98.0°F | Resp 18 | Ht 60.0 in | Wt 112.1 lb

## 2017-01-08 DIAGNOSIS — Z171 Estrogen receptor negative status [ER-]: Principal | ICD-10-CM

## 2017-01-08 DIAGNOSIS — C50412 Malignant neoplasm of upper-outer quadrant of left female breast: Secondary | ICD-10-CM | POA: Diagnosis not present

## 2017-01-08 DIAGNOSIS — D6851 Activated protein C resistance: Secondary | ICD-10-CM

## 2017-01-08 DIAGNOSIS — D0512 Intraductal carcinoma in situ of left breast: Secondary | ICD-10-CM

## 2017-01-08 LAB — CBC WITH DIFFERENTIAL/PLATELET
BASO%: 0.5 % (ref 0.0–2.0)
BASOS ABS: 0 10*3/uL (ref 0.0–0.1)
EOS ABS: 0 10*3/uL (ref 0.0–0.5)
EOS%: 0.3 % (ref 0.0–7.0)
HEMATOCRIT: 42.3 % (ref 34.8–46.6)
HEMOGLOBIN: 14.3 g/dL (ref 11.6–15.9)
LYMPH#: 1.4 10*3/uL (ref 0.9–3.3)
LYMPH%: 18.7 % (ref 14.0–49.7)
MCH: 32.3 pg (ref 25.1–34.0)
MCHC: 33.8 g/dL (ref 31.5–36.0)
MCV: 95.4 fL (ref 79.5–101.0)
MONO#: 0.5 10*3/uL (ref 0.1–0.9)
MONO%: 6.6 % (ref 0.0–14.0)
NEUT#: 5.7 10*3/uL (ref 1.5–6.5)
NEUT%: 73.9 % (ref 38.4–76.8)
Platelets: 185 10*3/uL (ref 145–400)
RBC: 4.44 10*6/uL (ref 3.70–5.45)
RDW: 13 % (ref 11.2–14.5)
WBC: 7.7 10*3/uL (ref 3.9–10.3)

## 2017-01-08 LAB — COMPREHENSIVE METABOLIC PANEL
ALBUMIN: 4.1 g/dL (ref 3.5–5.0)
ALK PHOS: 42 U/L (ref 40–150)
ALT: 17 U/L (ref 0–55)
AST: 20 U/L (ref 5–34)
Anion Gap: 9 mEq/L (ref 3–11)
BUN: 14.5 mg/dL (ref 7.0–26.0)
CALCIUM: 10.3 mg/dL (ref 8.4–10.4)
CHLORIDE: 105 meq/L (ref 98–109)
CO2: 28 mEq/L (ref 22–29)
Creatinine: 0.8 mg/dL (ref 0.6–1.1)
EGFR: 85 mL/min/{1.73_m2} — AB (ref >90)
Glucose: 99 mg/dl (ref 70–140)
POTASSIUM: 4.1 meq/L (ref 3.5–5.1)
Sodium: 143 mEq/L (ref 136–145)
Total Bilirubin: 0.97 mg/dL (ref 0.20–1.20)
Total Protein: 7.1 g/dL (ref 6.4–8.3)

## 2017-01-08 MED ORDER — GADOBENATE DIMEGLUMINE 529 MG/ML IV SOLN
12.0000 mL | Freq: Once | INTRAVENOUS | Status: AC | PRN
  Administered 2017-01-08: 12 mL via INTRAVENOUS

## 2017-01-09 ENCOUNTER — Telehealth: Payer: Self-pay | Admitting: *Deleted

## 2017-01-09 ENCOUNTER — Telehealth: Payer: Self-pay | Admitting: Oncology

## 2017-01-09 NOTE — Telephone Encounter (Signed)
CALLED PATIENT TO INFORM OF GENETICS APPT. ON 01-15-17 @ 10 AM WITH KAREN POWELL, SPOKE WITH PATIENT AND SHE IS AWARE OF THIS APPT.

## 2017-01-09 NOTE — Telephone Encounter (Signed)
sw pt to confirm genetics appt 7/9 at 10 am per sch msg

## 2017-01-11 ENCOUNTER — Telehealth: Payer: Self-pay | Admitting: Oncology

## 2017-01-11 ENCOUNTER — Ambulatory Visit (HOSPITAL_BASED_OUTPATIENT_CLINIC_OR_DEPARTMENT_OTHER): Payer: 59 | Admitting: Genetics

## 2017-01-11 ENCOUNTER — Encounter: Payer: Self-pay | Admitting: Genetics

## 2017-01-11 ENCOUNTER — Other Ambulatory Visit: Payer: 59

## 2017-01-11 DIAGNOSIS — Z171 Estrogen receptor negative status [ER-]: Principal | ICD-10-CM

## 2017-01-11 DIAGNOSIS — Z803 Family history of malignant neoplasm of breast: Secondary | ICD-10-CM

## 2017-01-11 DIAGNOSIS — Z7183 Encounter for nonprocreative genetic counseling: Secondary | ICD-10-CM | POA: Diagnosis not present

## 2017-01-11 DIAGNOSIS — D0512 Intraductal carcinoma in situ of left breast: Secondary | ICD-10-CM

## 2017-01-11 DIAGNOSIS — Z809 Family history of malignant neoplasm, unspecified: Secondary | ICD-10-CM | POA: Diagnosis not present

## 2017-01-11 DIAGNOSIS — C50412 Malignant neoplasm of upper-outer quadrant of left female breast: Secondary | ICD-10-CM | POA: Diagnosis not present

## 2017-01-11 NOTE — Progress Notes (Signed)
REFERRING PROVIDER: Chauncey Cruel, MD [address] [place], [place] [address]  PRIMARY PROVIDER:  Delsa Bern, MD  PRIMARY REASON FOR VISIT:  1. Carcinoma of upper-outer quadrant of left breast in female, estrogen receptor negative ([place])   2. Family history of breast cancer      HISTORY OF PRESENT ILLNESS:   Anita Frazier, a 51 y.o. female, was seen for a [facility] cancer genetics consultation at the request of Dr. Jana Hakim due to a personal and family history of breast cancer.  Anita Frazier presents to clinic today to discuss the possibility of a hereditary predisposition to cancer, genetic testing, and to further clarify her future cancer risks, as well as potential cancer risks for family members.   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  It is ER-, PR - and HER2 +.  She is currently planning her surgical treatment, and hopes to have this genetic testing information to inform these decisions.     HORMONAL RISK FACTORS:  Menarche was at age 76.  First live birth at age 28.  OCP use for approximately 2 years.  Ovaries intact: yes.  Hysterectomy: yes.  Menopausal status: postmenopausal.  HRT use: 0 years. Colonoscopy: no; not examined. Mammogram within the last year: yes.  Past Medical History:  Diagnosis Date  . Abnormal Pap smear 1992 & 3/99   LEEP  . ASCUS (atypical squamous cells of undetermined significance) on Pap smear 08/1997  . 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 Dysplasia of cervix, low grade (CIN 1)   . Factor 5 Leiden mutation, heterozygous ([place]) 06/09/04  . Irregular menses 01/2001  . Menstrual period late 11/17/10  . Pelvic pain 12/05/06    Past Surgical History:  Procedure Laterality Date  . ARTHROSCOPIC REPAIR ACL Left 2008  . LEEP  1992    Social History   Social History  . Marital status: Married     Spouse name: N/A  . Number of children: N/A  . Years of education: N/A   Social History Main Topics  . Smoking status: Never Smoker  . Smokeless tobacco: Never Used  . Alcohol use Yes     Comment: 2-3 drinks weekly  . Drug use: No  . Sexual activity: Yes    Birth control/ protection: Condom   Other Topics Concern  . Not on file   Social History Narrative  . No narrative on file     FAMILY HISTORY:  We obtained a detailed, 4-generation family history.  Significant diagnoses are listed below: Family History  Problem Relation Age of Onset  . Heart disease Father 16       MI  . Cancer Maternal Aunt 42       breast  . Hypertension Mother        chronic  . COPD Son        Respiratory affective disease   . Diabetes Maternal Grandmother    Anita Frazier has a 41 year old son and a set of twins (female and female) ages 66.  No history of cancer for these children.  Anita Frazier has a sister who is 39 who has 2 sons (ages 59 and 55) with no history of cancer.  Anita Frazier mother is 87 with no history of cancer and has never had her ovaries or uterus removed.   Anita Frazier has 4  maternal aunts: -One maternal aunt is 79 and has a 67 year-old daughter.  No history of cancer for these relatives.  -One maternal aunt is 13, and was diagnosed with breast cancer at 64.  She had a hysterectomy shortly after her cancer diagnosis.  This maternal aunt has a 50 year-old son with no history of cancer.     -One maternal aunt is 66 and was diagnosed with breast cancer at 85.  She has no children. -One maternal aunt is 26, and has no biological children or any history of cancer.     Anita Frazier father is 65 and has no history of cancer.  Anita Frazier paternal aunt is 2 with no children or any history of cancer. Anita Frazier paternal uncle is 43 with no biological children or any history of cancer.   Ms. Calamari paternal grandmother was diagnosed with cancer in her 46'Frazier and died in her 9'Frazier/80'Frazier.  The type of cancer is  unknown.  This paternal grandmother had a sister who was diagnosed with breast cancer in her 36'Frazier and died in her 70.Frazier  She had children. Not much information is known about their health, but no known cancer history.  Ms. Allbright paternal grandfather died in his 58'Frazier due to a stroke.  He had a brother (pateint'Frazier great uncle) with no history of cancer.    Anita Frazier reports that she believes her aunt had some genetic testing, but is unaware if she had BRCA1/BRCA2 or a panel of testing. She does not have access to this genetic testing report at this time, but said she could try to obtain this report. Patient'Frazier maternal ancestors are of Irish/English descent, and paternal ancestors are of English descent. There is No reported Ashkenazi Jewish ancestry. There is No known consanguinity.  GENETIC COUNSELING ASSESSMENT: Anita Frazier is a 51 y.o. female with a personal and family history which is somewhat suggestive of a Hereditary Cancer Predisposition Syndrome and predisposition to cancer. We, therefore, discussed and recommended the following at today'Frazier visit.   DISCUSSION: We reviewed the characteristics, features and inheritance patterns of hereditary cancer syndromes. We also discussed genetic testing, including the appropriate family members to test, the process of testing, insurance coverage and turn-around-time for results. We discussed the implications of a negative, positive and/or variant of uncertain significant result. We recommended Anita Frazier pursue genetic testing for the Hereditary Breast Cancer STAT Panel.The STAT Breast cancer panel offered by [facility] includes sequencing and rearrangement analysis for the following 9 genes:  ATM, BRCA1, BRCA2, CDH1, CHEK2, PALB2, PTEN, STK11 and TP53.  If this panel is negative, we recommend reflex testing to the Common Hereditary Cancer 46 gene Panel. The Hereditary Gene Panel offered by [facility] includes sequencing and/or deletion duplication testing of the following  46 genes: APC, ATM, AXIN2, BARD1, BMPR1A, BRCA1, BRCA2, BRIP1, CDH1, CDKN2A (p14ARF), CDKN2A (p16INK4a), CHEK2, CTNNA1, DICER1, EPCAM (Deletion/duplication testing only), GREM1 (promoter region deletion/duplication testing only), KIT, MEN1, MLH1, MSH2, MSH3, MSH6, MUTYH, NBN, NF1, NHTL1, PALB2, PDGFRA, PMS2, POLD1, POLE, PTEN, RAD50, RAD51C, RAD51D, SDHB, SDHC, SDHD, SMAD4, SMARCA4. STK11, TP53, TSC1, TSC2, and VHL.  The following genes were evaluated for sequence changes only: SDHA and HOXB13 c.251G>A variant only.  Anita Frazier will inform me when I reveal her results if she would like to pursue reflex testing or not.    We discussed that only 5-10% of cancers are associated with a Hereditary cancer predisposition syndrome.  One of the most common hereditary cancer syndromes that increases  breast cancer risk is called Hereditary Breast and Ovarian Cancer (HBOC) syndrome.  This syndrome is caused by mutations in the BRCA1 and BRCA2 genes.  This syndrome increases an individual'Frazier lifetime risk to develop breast, ovarian, pancreatic, and other types of cancer.  There are also many other cancer predisposition syndromes caused by mutations in several other genes.  We discussed that if she is found to have a mutation in one of these genes, it may impact surgical decisions, and alter future medical management recommendations such as increased cancer screenings and consideration of risk reducing surgeries.  A positive result could also have implications for the patient'Frazier family members.  A Negative result would mean we were unable to identify a hereditary component to her cancer, but does not rule out the possibility of a hereditary basis for her cancer.  There could be mutations that are undetectable by current technology, or in genes not yet tested or identified to increase cancer risk.    We discussed the potential to find a Variant of Uncertain Significance or VUS.  These are variants that have not yet been  identified as pathogenic or benign, and it is unknown if this variant is associated with increased cancer risk or if this is a normal finding.  Most VUS'Frazier are reclassified to benign or likely benign.   It should not be used to make medical management decisions. With time, we suspect the lab will determine the significance of any VUS'Frazier identified if any.   Based on Anita Frazier'Frazier personal and family history of cancer, she meets medical criteria for genetic testing. Despite that she meets criteria, she may still have an out of pocket cost. We discussed that if her out of pocket cost for testing is over $100, the laboratory will call and confirm whether she wants to proceed with testing.  If the out of pocket cost of testing is less than $100 she will be billed by the genetic testing laboratory.    PLAN: After considering the risks, benefits, and limitations, Anita Frazier  provided informed consent to pursue genetic testing and the blood sample was sent to [facility] for analysis of the Breast Cancer STAT Panel. The STAT Breast cancer panel offered by [facility] includes sequencing and rearrangement analysis for the following 9 genes:  ATM, BRCA1, BRCA2, CDH1, CHEK2, PALB2, PTEN, STK11 and TP53.  Results should be available within approximately 7-10 days' time, at which point they will be disclosed by telephone to Anita Frazier, as will any additional recommendations warranted by these results. Ms. Sliwinski will receive a summary of her genetic counseling visit and a copy of her results once available. This information will also be available in Epic. We encouraged Ms. Dills to remain in contact with cancer genetics annually so that we can continuously update the family history and inform her of any changes in cancer genetics and testing that may be of benefit for her family. Ms. Arriola questions were answered to her satisfaction today. Our contact information was provided should additional questions or concerns  arise.  Lastly, we encouraged Ms. Lapaglia to remain in contact with cancer genetics annually so that we can continuously update the family history and inform her of any changes in cancer genetics and testing that may be of benefit for this family.   Ms.  Argueta questions were answered to her satisfaction today. Our contact information was provided should additional questions or concerns arise. Thank you for the referral and allowing [place] to share in the care  of your patient.   Tana Felts, MS Genetic Counselor [email or web address] phone: [phone]  The patient was seen for a total of 40 minutes in face-to-face genetic counseling.  This patient was discussed with Drs. Magrinat, Lindi Adie and/or Burr Medico who agrees with the above. She was accompanied by her mother and sister at her appointment today.

## 2017-01-11 NOTE — Telephone Encounter (Signed)
sw pt to confirm 8/22 appt per sch msg

## 2017-01-15 ENCOUNTER — Other Ambulatory Visit: Payer: 59

## 2017-01-15 ENCOUNTER — Encounter: Payer: 59 | Admitting: Genetic Counselor

## 2017-01-17 ENCOUNTER — Telehealth: Payer: Self-pay | Admitting: Genetics

## 2017-01-17 ENCOUNTER — Encounter: Payer: Self-pay | Admitting: Genetics

## 2017-01-17 DIAGNOSIS — Z1379 Encounter for other screening for genetic and chromosomal anomalies: Secondary | ICD-10-CM | POA: Insufficient documentation

## 2017-01-17 NOTE — Telephone Encounter (Addendum)
Revealed negative genetic testing.  Discussed that while this is reassuring, we do not know why she has breast cancer or why there is cancer in the family. It could be due to a different gene that we are not testing, or maybe our current technology may not be able to pick something up.  Discussed doing reflex genetic testing to the Common Hereditary Cancer Panel (46 genes total), and patient elected to have this additional genetic testing.  I discussed that her daughter and other family members may still be at risk to develop cancer just based on the family history and should follow their doctor's recommendations.  I will call her when the results of her reflex genetic testing are available and email her genetic test results to her.   She was tested for the STAT Breast Cancer Panel.  The STAT Breast cancer panel offered by [facility] includes sequencing and rearrangement analysis for the following 9 genes:  ATM, BRCA1, BRCA2, CDH1, CHEK2, PALB2, PTEN, STK11 and TP53.

## 2017-01-19 ENCOUNTER — Ambulatory Visit: Payer: Self-pay | Admitting: General Surgery

## 2017-01-19 DIAGNOSIS — D0512 Intraductal carcinoma in situ of left breast: Secondary | ICD-10-CM

## 2017-01-31 ENCOUNTER — Encounter: Payer: Self-pay | Admitting: Genetics

## 2017-01-31 ENCOUNTER — Telehealth: Payer: Self-pay | Admitting: Genetics

## 2017-01-31 ENCOUNTER — Ambulatory Visit: Payer: Self-pay | Admitting: Genetics

## 2017-01-31 DIAGNOSIS — Z171 Estrogen receptor negative status [ER-]: Principal | ICD-10-CM

## 2017-01-31 DIAGNOSIS — C50412 Malignant neoplasm of upper-outer quadrant of left female breast: Secondary | ICD-10-CM

## 2017-01-31 DIAGNOSIS — Z803 Family history of malignant neoplasm of breast: Secondary | ICD-10-CM

## 2017-01-31 DIAGNOSIS — Z1589 Genetic susceptibility to other disease: Secondary | ICD-10-CM | POA: Insufficient documentation

## 2017-01-31 NOTE — Telephone Encounter (Signed)
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  Discussed that there may be marginal increase in colon cancer risk but no additional screening recommendations at this time.  Discussed that other relatives could have testing for this mutation now that it has been identified in the family.  Discussed that we do not know why she has breast cancer or why there is breast cancer in the family. It could be due to a different gene that we are not testing, or maybe our current technology may not be able to pick something up.  It will be important for her to keep in contact with genetics to keep up with whether additional testing may be needed.

## 2017-01-31 NOTE — Progress Notes (Signed)
HPI: Ms. Hollingworth was previously seen in the [facility] clinic due to a personal and family of cancer and concerns regarding a hereditary predisposition to cancer. Please refer to our prior cancer genetics clinic note for more information regarding Ms. Trolinger's medical, social and family histories, and our assessment and recommendations, at the time. Ms. Pledger recent genetic test results were disclosed to her, as were recommendations warranted by these results. These results and recommendations are discussed in more detail below.   FAMILY HISTORY:  We obtained a detailed, 4-generation family history.  Significant diagnoses are listed below: Family History  Problem Relation Age of Onset  . Heart disease Father 9       MI  . Breast cancer Maternal Aunt 27       now is 71  . Hypertension Mother        chronic  . COPD Son        Respiratory affective disease   . Diabetes Maternal Grandmother   . Cancer Maternal Grandmother 69       type unknown  . Breast cancer Maternal Aunt 18       now is 76  Ms. Topp has a 12 year old son and a set of twins (female and female) ages 77.  No history of cancer for these children.  Ms. Briley has a sister who is 78 who has 2 sons (ages 73 and 29) with no history of cancer.  Ms. Solimine mother is 32 with no history of cancer and has never had her ovaries or uterus removed.   Ms. Bonneau has 4 maternal aunts: -One maternal aunt is 42 and has a 11 year-old daughter.  No history of cancer for these relatives.  -One maternal aunt is 15, and was diagnosed with breast cancer at 68.  She had a hysterectomy shortly after her cancer diagnosis.  This maternal aunt has a 53 year-old son with no history of cancer.     -One maternal aunt is 33 and was diagnosed with breast cancer at 41.  She has no children. -One maternal aunt is 54, and has no biological children or any history of cancer.     Ms. Torry father is 14 and has no history of cancer.  Ms. Kwolek paternal  aunt is 18 with no children or any history of cancer. Ms. Speece paternal uncle is 19 with no biological children or any history of cancer.   Ms. Nicol paternal grandmother was diagnosed with cancer in her 24's and died in her 1's/80's.  The type of cancer is unknown.  This paternal grandmother had a sister who was diagnosed with breast cancer in her 32's and died in her 70.s  She had children. Not much information is known about their health, but no known cancer history.  Ms. Ricchio paternal grandfather died in his 39's due to a stroke.  He had a brother (pateint's great uncle) with no history of cancer.    Ms. Zuleta reports that she believes her aunt had some genetic testing, but is unaware if she had BRCA1/BRCA2 or a panel of testing. She does not have access to this genetic testing report at this time, but said she could try to obtain this report. Patient's maternal ancestors are of Irish/English descent, and paternal ancestors are of English descent. There is No reported Ashkenazi Jewish ancestry. There is No known consanguinity.  GENETIC TEST RESULTS: At the time of Ms. Tamburro's visit, we recommended she pursue genetic testing  of the [facility] Breast Cancer STAT panel with reflex testing to the [facility] Common Hereditary Cancer Panel. This test, which included sequencing and deletion/duplication analysis of the genes listed on the test report, was performed at [facility]. The Hereditary Gene Panel offered by [facility] includes sequencing and/or deletion duplication testing of the following 46 genes: APC, ATM, AXIN2, BARD1, BMPR1A, BRCA1, BRCA2, BRIP1, CDH1, CDKN2A (p14ARF), CDKN2A (p16INK4a), CHEK2, CTNNA1, DICER1, EPCAM (Deletion/duplication testing only), GREM1 (promoter region deletion/duplication testing only), KIT, MEN1, MLH1, MSH2, MSH3, MSH6, MUTYH, NBN, NF1, NHTL1, PALB2, PDGFRA, PMS2, POLD1, POLE, PTEN, RAD50, RAD51C, RAD51D, SDHB, SDHC, SDHD, SMAD4, SMARCA4. STK11, TP53, TSC1, TSC2, and  VHL.  The following genes were evaluated for sequence changes only: SDHA and HOXB13 c.251G>A variant only.   Ms. Dhaliwal was called today with her genetic test results.  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 Since Ms. Alcaraz has only one pathogenic mutation in MUTYH, she is NOT affected with MUTYH-associated polyposis, but instead is a carrier. A copy of the test report has been scanned into Epic and is located under the Molecular Pathology section of the Results Review tab.     SCREENING RECOMMENDATIONS: We discussed the implications of a heterozygous MUTYH mutation for Ms. Mccrory, and discussed who else in the family should have genetic testing. We recommended Ms. Perry follow management guidelines for heterozygous MUTYH mutations; all of which are outlined below. These can be coordinated by Ms. Braxton's GI doctor or her primary provider.  The risk for colon cancer in MUTYH heterozygotes (1 mutation) is not well established and has been reported to be moderate at most.    Personal history of colon cancer  Follow instructions provided by your physician based on your personal history.  Do not have a personal history of colon cancer but have a parent/sibling/child with colon cancer: Colonoscopy every 5 years starting at age 52 or 26 years younger than the earliest age of onset, whichever is younger.  Do not have a personal history of colon cancer and do not have a parent/sibling/child with colon cancer: The data are uncertain if specialized screening is warranted.    Below is a direct exert from the current NCCN guidelines:   There is no reported family history of colon cancer in Ms. Stokely's family.  We therefore recommended that Ms. Giovanni have a colonoscopy as she is 51 and is recommended to have a colonoscopy by general population recommendations.  We also recommended that she follow all cancer screening and treatment  recommendations from her oncology, primary care, and GI physicians.    At this time we are not able to explain the cause of her breast cancer or the family history of breast cancer.  It is possible that her breast cancer is not related to a hereditary cause, but we cannot rule this out as a potential cause.  It is still possible that there could be genetic mutations that are undetectable by current technology, or genetic mutations in genes that have not been tested or identified to increase cancer risk.   FAMILY MEMBERS: Since we now know the MUTHY mutation in Ms. Concepcion, we can test at-risk relatives to determine whether or not they have inherited the mutation. All relatives have a risk to carry this mutation and could have have genetic counseling and genetic testing. We will be happy to meet with any of the family members or refer them to a genetic counselor in their local area. To  locate genetic counselors in other cities, individuals can visit the website of the [employer] (ArtistMovie.se) and Secretary/administrator for a Social worker by zip code.   We discussed that her children have a 50% chance each to have inherited this one MUTYH mutation.  If her children's father also carries a MUTYH mutation, then there would be a risk for her children to have MUTYH associated polyposis (polyposis and increased cancer risk).  There is also a 50% chance for other first degree relatives (siblings and parents) to have this mutation.  Women in this family might be at some increased risk of developing breast or other cancers, over the general population risk, simply due to the family history of cancer. We recommended women in this family have a yearly mammogram beginning at age 65, or 25 years younger than the earliest onset of cancer, an annual clinical breast exam, and perform monthly breast self-exams. Women in this family should also have a gynecological exam as recommended by their primary provider. All  family members should have a colonoscopy by age 66 (or earlier dependent on family history).  We strongly encouraged Ms. Daniely to remain in contact with [place] in cancer genetics on an annual basis so we can update Ms. Conde's personal and family histories, and inform her of advances in cancer genetics that may be of benefit for the entire family. Ms. Teegarden knows she is also welcome to call with any questions or concerns, or to schedule an appointment to discuss these results further if she chooses.  Tana Felts, [place]  Genetic Counselor  [phone]

## 2017-02-01 ENCOUNTER — Encounter: Payer: Self-pay | Admitting: Genetics

## 2017-02-01 ENCOUNTER — Ambulatory Visit: Payer: Self-pay | Admitting: General Surgery

## 2017-02-01 DIAGNOSIS — D0512 Intraductal carcinoma in situ of left breast: Secondary | ICD-10-CM

## 2017-02-07 DIAGNOSIS — C50412 Malignant neoplasm of upper-outer quadrant of left female breast: Secondary | ICD-10-CM | POA: Diagnosis not present

## 2017-02-07 DIAGNOSIS — Z171 Estrogen receptor negative status [ER-]: Secondary | ICD-10-CM | POA: Diagnosis not present

## 2017-02-07 NOTE — H&P (Signed)
Subjective:     Patient ID: Anita Frazier is a 51 y.o. female.  HPI  Here for follow up discussion breast reconstruction. Presented following screening MMG with 4.3 cm pleomorphic calcifications in the left breast UOQ. US revealed a left breast mass measuring 7 cm, left axilla negative. 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 MRI noted NME throughout the LOQ measuring 7 cm extent, corresponds to the extent of suspicious calcifications seen on MMG.  Plan for mastectomy given extent of disease, she has elected for bilateral.  Two maternal aunts with breast ca, onee aunt has had implant based reconstruction. 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  PMH significant for Factor V Leiden mutation heterozygous.  Current 34 C, if she proceeds with mastectomy goal would be smaller volume.  Works as Orthoptist. Three kids, youngest are twins that will be off to college in August.  Review of Systems      Objective:   Physical Exam  Cardiovascular: Normal rate, regular rhythm and normal heart sounds.   Pulmonary/Chest: Effort normal and breath sounds normal.  Abdominal:  Minimal soft tissue volume/redundancy  Lymphadenopathy:    She has no axillary adenopathy.  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   Reviewed incisions, drains, OR length, hospital stay and post operative visits and limitations. Reviewed process of expansion and implant based risks including rupture, MRI surveillance for silicone implants, infection requiring surgery or removal, contracture. Reviewed risks mastectomy flap  necrosis requiring additional surgery or removal expanders. Reviewed asensate reconstruction. Discussed use of ADM in breast reconstruction, cadaveric source, incorporation over weeks. Reviewed if problems of seroma or infection in early period prior to incorporation can contribute to infection and may require removal. Additional risks including but not limited to bleeding, infection, hematoma, seroma, DVT/PE, cardiopulmonary complications, damage to deeper structures reviewed.   Discussed future surgery dependent on adjuvant treatments. As noted above, suspicious for invasive disease and may need XRT or chemotherapy pending final pathology. She has also discussed this chance with Dr. Jana Hakim. Rx for post mastectomy supplies given.   Irene Limbo, MD [facility] Plastic & Reconstructive Surgery [phone], pin [phone]

## 2017-02-08 ENCOUNTER — Encounter (HOSPITAL_BASED_OUTPATIENT_CLINIC_OR_DEPARTMENT_OTHER): Payer: Self-pay | Admitting: *Deleted

## 2017-02-08 NOTE — Progress Notes (Signed)
PT in for PAT appointment, Boost given and instructions explained. Showed pt and daughter Ashlyn  RCC room, explained our flow and what she could expect dos.

## 2017-02-09 NOTE — Progress Notes (Signed)
Chart and pmh discussed with Dr. Ambrose Pancoast.  [place] for surgery at [facility] 02/15/17.

## 2017-02-15 ENCOUNTER — Ambulatory Visit (HOSPITAL_BASED_OUTPATIENT_CLINIC_OR_DEPARTMENT_OTHER): Payer: 59 | Admitting: Anesthesiology

## 2017-02-15 ENCOUNTER — Encounter (HOSPITAL_BASED_OUTPATIENT_CLINIC_OR_DEPARTMENT_OTHER): Payer: Self-pay | Admitting: *Deleted

## 2017-02-15 ENCOUNTER — Encounter (HOSPITAL_COMMUNITY)
Admission: RE | Admit: 2017-02-15 | Discharge: 2017-02-15 | Disposition: A | Discharge status: Home / Self Care | Payer: 59 | Source: Ambulatory Visit | Attending: General Surgery | Admitting: General Surgery

## 2017-02-15 ENCOUNTER — Ambulatory Visit (HOSPITAL_BASED_OUTPATIENT_CLINIC_OR_DEPARTMENT_OTHER)
Admission: RE | Admit: 2017-02-15 | Discharge: 2017-02-16 | Disposition: A | Discharge status: Home / Self Care | Payer: 59 | Source: Ambulatory Visit | Attending: General Surgery | Admitting: General Surgery

## 2017-02-15 ENCOUNTER — Encounter (HOSPITAL_BASED_OUTPATIENT_CLINIC_OR_DEPARTMENT_OTHER)
Admission: RE | Disposition: A | Discharge status: Home / Self Care | Payer: Self-pay | Source: Ambulatory Visit | Attending: General Surgery

## 2017-02-15 DIAGNOSIS — N6011 Diffuse cystic mastopathy of right breast: Secondary | ICD-10-CM | POA: Insufficient documentation

## 2017-02-15 DIAGNOSIS — N641 Fat necrosis of breast: Secondary | ICD-10-CM | POA: Diagnosis not present

## 2017-02-15 DIAGNOSIS — D0512 Intraductal carcinoma in situ of left breast: Secondary | ICD-10-CM | POA: Diagnosis not present

## 2017-02-15 DIAGNOSIS — Z4001 Encounter for prophylactic removal of breast: Secondary | ICD-10-CM | POA: Diagnosis not present

## 2017-02-15 DIAGNOSIS — N6091 Unspecified benign mammary dysplasia of right breast: Secondary | ICD-10-CM | POA: Diagnosis not present

## 2017-02-15 DIAGNOSIS — C50912 Malignant neoplasm of unspecified site of left female breast: Secondary | ICD-10-CM | POA: Diagnosis not present

## 2017-02-15 DIAGNOSIS — Z8249 Family history of ischemic heart disease and other diseases of the circulatory system: Secondary | ICD-10-CM | POA: Insufficient documentation

## 2017-02-15 DIAGNOSIS — N6081 Other benign mammary dysplasias of right breast: Secondary | ICD-10-CM | POA: Diagnosis not present

## 2017-02-15 DIAGNOSIS — C50412 Malignant neoplasm of upper-outer quadrant of left female breast: Secondary | ICD-10-CM | POA: Diagnosis not present

## 2017-02-15 DIAGNOSIS — D6851 Activated protein C resistance: Secondary | ICD-10-CM | POA: Diagnosis not present

## 2017-02-15 DIAGNOSIS — R011 Cardiac murmur, unspecified: Secondary | ICD-10-CM | POA: Diagnosis not present

## 2017-02-15 DIAGNOSIS — Z803 Family history of malignant neoplasm of breast: Secondary | ICD-10-CM | POA: Diagnosis not present

## 2017-02-15 HISTORY — PX: BREAST RECONSTRUCTION WITH PLACEMENT OF TISSUE EXPANDER AND FLEX HD (ACELLULAR HYDRATED DERMIS): SHX6295

## 2017-02-15 HISTORY — PX: MASTECTOMY W/ SENTINEL NODE BIOPSY: SHX2001

## 2017-02-15 LAB — POCT HEMOGLOBIN-HEMACUE: HEMOGLOBIN: 13.2 g/dL (ref 12.0–15.0)

## 2017-02-15 SURGERY — MASTECTOMY WITH SENTINEL LYMPH NODE BIOPSY
Anesthesia: General | Site: Chest | Laterality: Bilateral

## 2017-02-15 MED ORDER — KETOROLAC TROMETHAMINE 30 MG/ML IJ SOLN
30.0000 mg | Freq: Three times a day (TID) | INTRAMUSCULAR | Status: DC
  Administered 2017-02-15 – 2017-02-16 (×2): 30 mg via INTRAVENOUS
  Filled 2017-02-15 (×2): qty 1

## 2017-02-15 MED ORDER — MEPERIDINE HCL 25 MG/ML IJ SOLN: 6.2500 mg | INTRAMUSCULAR | Status: DC | PRN

## 2017-02-15 MED ORDER — DEXAMETHASONE SODIUM PHOSPHATE 10 MG/ML IJ SOLN
INTRAMUSCULAR | Status: AC
  Filled 2017-02-15: qty 1

## 2017-02-15 MED ORDER — HYDROCODONE-ACETAMINOPHEN 5-325 MG PO TABS: 1.0000 | ORAL_TABLET | Freq: Four times a day (QID) | ORAL | 0 refills | Status: DC | PRN

## 2017-02-15 MED ORDER — PHENYLEPHRINE 40 MCG/ML (10ML) SYRINGE FOR IV PUSH (FOR BLOOD PRESSURE SUPPORT)
PREFILLED_SYRINGE | INTRAVENOUS | Status: DC | PRN
  Administered 2017-02-15 (×3): 80 ug via INTRAVENOUS

## 2017-02-15 MED ORDER — CELECOXIB 400 MG PO CAPS
400.0000 mg | ORAL_CAPSULE | ORAL | Status: AC
  Administered 2017-02-15: 400 mg via ORAL

## 2017-02-15 MED ORDER — HEPARIN SODIUM (PORCINE) 5000 UNIT/ML IJ SOLN: 5000.0000 [IU] | Freq: Three times a day (TID) | INTRAMUSCULAR | Status: DC

## 2017-02-15 MED ORDER — CEFAZOLIN SODIUM-DEXTROSE 2-4 GM/100ML-% IV SOLN
2.0000 g | INTRAVENOUS | Status: AC
  Administered 2017-02-15: 2 g via INTRAVENOUS

## 2017-02-15 MED ORDER — BUPIVACAINE-EPINEPHRINE (PF) 0.25% -1:200000 IJ SOLN
INTRAMUSCULAR | Status: AC
Start: 2017-02-15 — End: 2017-02-15
  Filled 2017-02-15: qty 30

## 2017-02-15 MED ORDER — KCL IN DEXTROSE-NACL 20-5-0.9 MEQ/L-%-% IV SOLN: INTRAVENOUS | Status: DC

## 2017-02-15 MED ORDER — ONDANSETRON HCL 4 MG/2ML IJ SOLN
INTRAMUSCULAR | Status: DC | PRN
  Administered 2017-02-15: 4 mg via INTRAVENOUS

## 2017-02-15 MED ORDER — OXYCODONE HCL 5 MG PO TABS: 5.0000 mg | ORAL_TABLET | Freq: Once | ORAL | Status: DC | PRN

## 2017-02-15 MED ORDER — ONDANSETRON HCL 4 MG/2ML IJ SOLN
INTRAMUSCULAR | Status: AC
  Filled 2017-02-15: qty 2

## 2017-02-15 MED ORDER — FENTANYL CITRATE (PF) 100 MCG/2ML IJ SOLN
INTRAMUSCULAR | Status: AC
  Filled 2017-02-15: qty 2

## 2017-02-15 MED ORDER — SODIUM CHLORIDE 0.9 % IV SOLN
INTRAVENOUS | Status: DC | PRN
  Administered 2017-02-15: 40 mL

## 2017-02-15 MED ORDER — ONDANSETRON 4 MG PO TBDP: 4.0000 mg | ORAL_TABLET | Freq: Four times a day (QID) | ORAL | Status: DC | PRN

## 2017-02-15 MED ORDER — ROCURONIUM BROMIDE 100 MG/10ML IV SOLN
INTRAVENOUS | Status: DC | PRN
  Administered 2017-02-15 (×2): 50 mg via INTRAVENOUS

## 2017-02-15 MED ORDER — OXYCODONE HCL 5 MG/5ML PO SOLN: 5.0000 mg | Freq: Once | ORAL | Status: DC | PRN

## 2017-02-15 MED ORDER — GLYCOPYRROLATE 0.2 MG/ML IJ SOLN
INTRAMUSCULAR | Status: DC | PRN
  Administered 2017-02-15: 0.2 mg via INTRAVENOUS

## 2017-02-15 MED ORDER — ACETAMINOPHEN 500 MG PO TABS
1000.0000 mg | ORAL_TABLET | ORAL | Status: AC
  Administered 2017-02-15: 1000 mg via ORAL

## 2017-02-15 MED ORDER — HYDROCODONE-ACETAMINOPHEN 5-325 MG PO TABS
1.0000 | ORAL_TABLET | ORAL | Status: DC | PRN
  Administered 2017-02-15 – 2017-02-16 (×3): 1 via ORAL
  Filled 2017-02-15 (×3): qty 1

## 2017-02-15 MED ORDER — SUGAMMADEX SODIUM 200 MG/2ML IV SOLN
INTRAVENOUS | Status: DC | PRN
  Administered 2017-02-15: 200 mg via INTRAVENOUS

## 2017-02-15 MED ORDER — PHENYLEPHRINE 40 MCG/ML (10ML) SYRINGE FOR IV PUSH (FOR BLOOD PRESSURE SUPPORT)
PREFILLED_SYRINGE | INTRAVENOUS | Status: AC
  Filled 2017-02-15: qty 20

## 2017-02-15 MED ORDER — LACTATED RINGERS IV SOLN
INTRAVENOUS | Status: DC
Start: 2017-02-15 — End: 2017-02-15
  Administered 2017-02-15 (×3): via INTRAVENOUS

## 2017-02-15 MED ORDER — CELECOXIB 200 MG PO CAPS
ORAL_CAPSULE | ORAL | Status: AC
  Filled 2017-02-15: qty 2

## 2017-02-15 MED ORDER — SUGAMMADEX SODIUM 200 MG/2ML IV SOLN
INTRAVENOUS | Status: AC
  Filled 2017-02-15: qty 2

## 2017-02-15 MED ORDER — ONDANSETRON HCL 4 MG/2ML IJ SOLN: 4.0000 mg | Freq: Four times a day (QID) | INTRAMUSCULAR | Status: DC | PRN

## 2017-02-15 MED ORDER — ROCURONIUM BROMIDE 10 MG/ML (PF) SYRINGE
PREFILLED_SYRINGE | INTRAVENOUS | Status: AC
  Filled 2017-02-15: qty 5

## 2017-02-15 MED ORDER — SODIUM CHLORIDE 0.9 % IJ SOLN
INTRAMUSCULAR | Status: AC
  Filled 2017-02-15: qty 10

## 2017-02-15 MED ORDER — KCL IN DEXTROSE-NACL 20-5-0.45 MEQ/L-%-% IV SOLN
INTRAVENOUS | Status: DC
Start: 2017-02-15 — End: 2017-02-16
  Administered 2017-02-15: 15:00:00 via INTRAVENOUS
  Filled 2017-02-15: qty 1000

## 2017-02-15 MED ORDER — METHOCARBAMOL 500 MG PO TABS
500.0000 mg | ORAL_TABLET | Freq: Four times a day (QID) | ORAL | Status: DC | PRN
  Administered 2017-02-15: 500 mg via ORAL
  Filled 2017-02-15: qty 1

## 2017-02-15 MED ORDER — PROMETHAZINE HCL 25 MG/ML IJ SOLN: 6.2500 mg | INTRAMUSCULAR | Status: DC | PRN

## 2017-02-15 MED ORDER — EPHEDRINE SULFATE 50 MG/ML IJ SOLN
INTRAMUSCULAR | Status: DC | PRN
  Administered 2017-02-15: 10 mg via INTRAVENOUS

## 2017-02-15 MED ORDER — HYDROMORPHONE HCL 1 MG/ML IJ SOLN
INTRAMUSCULAR | Status: AC
  Filled 2017-02-15: qty 0.5

## 2017-02-15 MED ORDER — HYDROMORPHONE HCL 1 MG/ML IJ SOLN
0.2500 mg | INTRAMUSCULAR | Status: DC | PRN
  Administered 2017-02-15 (×2): 0.5 mg via INTRAVENOUS

## 2017-02-15 MED ORDER — MIDAZOLAM HCL 2 MG/2ML IJ SOLN
1.0000 mg | INTRAMUSCULAR | Status: DC | PRN
  Administered 2017-02-15 (×2): 2 mg via INTRAVENOUS

## 2017-02-15 MED ORDER — SULFAMETHOXAZOLE-TRIMETHOPRIM 800-160 MG PO TABS: 1.0000 | ORAL_TABLET | Freq: Two times a day (BID) | ORAL | 0 refills | Status: DC

## 2017-02-15 MED ORDER — LIDOCAINE 2% (20 MG/ML) 5 ML SYRINGE
INTRAMUSCULAR | Status: DC | PRN
  Administered 2017-02-15: 60 mg via INTRAVENOUS

## 2017-02-15 MED ORDER — ACETAMINOPHEN 500 MG PO TABS
ORAL_TABLET | ORAL | Status: AC
  Filled 2017-02-15: qty 2

## 2017-02-15 MED ORDER — METHOCARBAMOL 500 MG PO TABS: 500.0000 mg | ORAL_TABLET | Freq: Three times a day (TID) | ORAL | 0 refills | Status: DC | PRN

## 2017-02-15 MED ORDER — MIDAZOLAM HCL 2 MG/2ML IJ SOLN
INTRAMUSCULAR | Status: AC
  Filled 2017-02-15: qty 2

## 2017-02-15 MED ORDER — DEXAMETHASONE SODIUM PHOSPHATE 4 MG/ML IJ SOLN
INTRAMUSCULAR | Status: DC | PRN
  Administered 2017-02-15: 10 mg via INTRAVENOUS

## 2017-02-15 MED ORDER — TECHNETIUM TC 99M SULFUR COLLOID FILTERED
1.0000 | Freq: Once | INTRAVENOUS | Status: AC | PRN
  Administered 2017-02-15: 1 via INTRADERMAL

## 2017-02-15 MED ORDER — PROPOFOL 10 MG/ML IV BOLUS
INTRAVENOUS | Status: DC | PRN
  Administered 2017-02-15: 130 mg via INTRAVENOUS

## 2017-02-15 MED ORDER — FENTANYL CITRATE (PF) 100 MCG/2ML IJ SOLN
50.0000 ug | INTRAMUSCULAR | Status: AC | PRN
  Administered 2017-02-15: 100 ug via INTRAVENOUS
  Administered 2017-02-15 (×2): 50 ug via INTRAVENOUS

## 2017-02-15 MED ORDER — METHYLENE BLUE 0.5 % INJ SOLN
INTRAVENOUS | Status: AC
  Filled 2017-02-15: qty 10

## 2017-02-15 MED ORDER — GABAPENTIN 300 MG PO CAPS
300.0000 mg | ORAL_CAPSULE | ORAL | Status: AC
  Administered 2017-02-15: 300 mg via ORAL

## 2017-02-15 MED ORDER — SCOPOLAMINE 1 MG/3DAYS TD PT72: 1.0000 | MEDICATED_PATCH | Freq: Once | TRANSDERMAL | Status: DC | PRN

## 2017-02-15 MED ORDER — MORPHINE SULFATE (PF) 2 MG/ML IV SOLN: 1.0000 mg | INTRAVENOUS | Status: DC | PRN

## 2017-02-15 MED ORDER — SODIUM CHLORIDE 0.9 % IV SOLN
INTRAVENOUS | Status: DC | PRN
  Administered 2017-02-15: 1000 mL

## 2017-02-15 MED ORDER — GABAPENTIN 300 MG PO CAPS
ORAL_CAPSULE | ORAL | Status: AC
  Filled 2017-02-15: qty 1

## 2017-02-15 MED ORDER — CEFAZOLIN SODIUM-DEXTROSE 2-4 GM/100ML-% IV SOLN
INTRAVENOUS | Status: AC
  Filled 2017-02-15: qty 100

## 2017-02-15 MED ORDER — LIDOCAINE 2% (20 MG/ML) 5 ML SYRINGE
INTRAMUSCULAR | Status: AC
  Filled 2017-02-15: qty 5

## 2017-02-15 MED ORDER — PANTOPRAZOLE SODIUM 40 MG IV SOLR
40.0000 mg | Freq: Every day | INTRAVENOUS | Status: DC
  Administered 2017-02-16: 40 mg via INTRAVENOUS
  Filled 2017-02-15: qty 40

## 2017-02-15 MED ORDER — CHLORHEXIDINE GLUCONATE CLOTH 2 % EX PADS: 6.0000 | MEDICATED_PAD | Freq: Once | CUTANEOUS | Status: DC

## 2017-02-15 SURGICAL SUPPLY — 92 items
ADH SKN CLS APL DERMABOND .7 (GAUZE/BANDAGES/DRESSINGS) ×4
ALLODERM 8X16 MED THICK (Tissue) ×8 IMPLANT
APPLIER CLIP 9.375 MED OPEN (MISCELLANEOUS) ×4
APR CLP MED 9.3 20 MLT OPN (MISCELLANEOUS) ×2
BAG DECANTER FOR FLEXI CONT (MISCELLANEOUS) ×4 IMPLANT
BINDER BREAST LRG (GAUZE/BANDAGES/DRESSINGS) ×4 IMPLANT
BINDER BREAST MEDIUM (GAUZE/BANDAGES/DRESSINGS) ×2 IMPLANT
BINDER BREAST XLRG (GAUZE/BANDAGES/DRESSINGS) IMPLANT
BINDER BREAST XXLRG (GAUZE/BANDAGES/DRESSINGS) IMPLANT
BIOPATCH RED 1 DISK 7.0 (GAUZE/BANDAGES/DRESSINGS) IMPLANT
BIOPATCH RED 1IN DISK 7.0MM (GAUZE/BANDAGES/DRESSINGS)
BLADE SURG 10 STRL SS (BLADE) ×8 IMPLANT
BLADE SURG 15 STRL LF DISP TIS (BLADE) ×2 IMPLANT
BLADE SURG 15 STRL SS (BLADE) ×4
BNDG GAUZE ELAST 4 BULKY (GAUZE/BANDAGES/DRESSINGS) ×4 IMPLANT
CANISTER SUCT 1200ML W/VALVE (MISCELLANEOUS) ×8 IMPLANT
CHLORAPREP W/TINT 26ML (MISCELLANEOUS) ×8 IMPLANT
CLIP APPLIE 9.375 MED OPEN (MISCELLANEOUS) ×2 IMPLANT
COVER BACK TABLE 60X90IN (DRAPES) ×6 IMPLANT
COVER MAYO STAND STRL (DRAPES) ×6 IMPLANT
COVER PROBE W GEL 5X96 (DRAPES) ×4 IMPLANT
DECANTER SPIKE VIAL GLASS SM (MISCELLANEOUS) IMPLANT
DERMABOND ADVANCED (GAUZE/BANDAGES/DRESSINGS) ×4
DERMABOND ADVANCED .7 DNX12 (GAUZE/BANDAGES/DRESSINGS) ×4 IMPLANT
DEVICE DISSECT PLASMABLAD 3.0S (MISCELLANEOUS) ×2 IMPLANT
DRAIN CHANNEL 15F RND FF W/TCR (WOUND CARE) ×2 IMPLANT
DRAIN CHANNEL 19F RND (DRAIN) ×6 IMPLANT
DRAPE LAPAROSCOPIC ABDOMINAL (DRAPES) ×2 IMPLANT
DRAPE SURG 17X23 STRL (DRAPES) ×8 IMPLANT
DRAPE TOP ARMCOVERS (MISCELLANEOUS) ×4 IMPLANT
DRAPE U-SHAPE 76X120 STRL (DRAPES) ×4 IMPLANT
DRAPE UTILITY XL STRL (DRAPES) ×4 IMPLANT
DRSG PAD ABDOMINAL 8X10 ST (GAUZE/BANDAGES/DRESSINGS) ×10 IMPLANT
DRSG TEGADERM 4X10 (GAUZE/BANDAGES/DRESSINGS) IMPLANT
DRSG TEGADERM 4X4.75 (GAUZE/BANDAGES/DRESSINGS) IMPLANT
ELECT BLADE 4.0 EZ CLEAN MEGAD (MISCELLANEOUS) ×4
ELECT COATED BLADE 2.86 ST (ELECTRODE) ×8 IMPLANT
ELECT REM PT RETURN 9FT ADLT (ELECTROSURGICAL) ×4
ELECTRODE BLDE 4.0 EZ CLN MEGD (MISCELLANEOUS) ×2 IMPLANT
ELECTRODE REM PT RTRN 9FT ADLT (ELECTROSURGICAL) ×4 IMPLANT
EVACUATOR SILICONE 100CC (DRAIN) ×8 IMPLANT
EXPANDER TISSUE MX 400CC (Breast) IMPLANT
GAUZE SPONGE 4X4 12PLY STRL LF (GAUZE/BANDAGES/DRESSINGS) ×4 IMPLANT
GLOVE BIO SURGEON STRL SZ 6 (GLOVE) ×10 IMPLANT
GLOVE BIO SURGEON STRL SZ7.5 (GLOVE) ×4 IMPLANT
GLOVE BIOGEL PI IND STRL 7.0 (GLOVE) IMPLANT
GLOVE BIOGEL PI INDICATOR 7.0 (GLOVE) ×4
GLOVE EXAM NITRILE MD LF STRL (GLOVE) ×2 IMPLANT
GLOVE SURG SS PI 6.5 STRL IVOR (GLOVE) ×4 IMPLANT
GOWN STRL REUS W/ TWL LRG LVL3 (GOWN DISPOSABLE) ×8 IMPLANT
GOWN STRL REUS W/TWL LRG LVL3 (GOWN DISPOSABLE) ×20
ILLUMINATOR WAVEGUIDE N/F (MISCELLANEOUS) IMPLANT
IV NS 1000ML (IV SOLUTION) ×4
IV NS 1000ML BAXH (IV SOLUTION) IMPLANT
IV NS 500ML (IV SOLUTION)
IV NS 500ML BAXH (IV SOLUTION) ×2 IMPLANT
KIT FILL SYSTEM UNIVERSAL (SET/KITS/TRAYS/PACK) ×4 IMPLANT
LIGHT WAVEGUIDE WIDE FLAT (MISCELLANEOUS) IMPLANT
MARKER SKIN DUAL TIP RULER LAB (MISCELLANEOUS) ×2 IMPLANT
NDL HYPO 25X1 1.5 SAFETY (NEEDLE) ×4 IMPLANT
NEEDLE HYPO 25X1 1.5 SAFETY (NEEDLE) IMPLANT
NS IRRIG 1000ML POUR BTL (IV SOLUTION) ×6 IMPLANT
PACK BASIN DAY SURGERY FS (CUSTOM PROCEDURE TRAY) ×6 IMPLANT
PENCIL BUTTON HOLSTER BLD 10FT (ELECTRODE) ×6 IMPLANT
PIN SAFETY STERILE (MISCELLANEOUS) ×8 IMPLANT
PLASMABLADE 3.0S (MISCELLANEOUS) ×4
SHEET MEDIUM DRAPE 40X70 STRL (DRAPES) ×4 IMPLANT
SLEEVE SCD COMPRESS KNEE MED (MISCELLANEOUS) ×6 IMPLANT
SPONGE LAP 18X18 X RAY DECT (DISPOSABLE) ×10 IMPLANT
STAPLER VISISTAT 35W (STAPLE) ×4 IMPLANT
SUT ETHILON 2 0 FS 18 (SUTURE) ×6 IMPLANT
SUT MNCRL AB 4-0 PS2 18 (SUTURE) ×6 IMPLANT
SUT PDS AB 2-0 CT2 27 (SUTURE) IMPLANT
SUT SILK 2 0 SH (SUTURE) ×2 IMPLANT
SUT VIC AB 0 CT1 27 (SUTURE) ×8
SUT VIC AB 0 CT1 27XBRD ANBCTR (SUTURE) IMPLANT
SUT VIC AB 3-0 SH 27 (SUTURE) ×24
SUT VIC AB 3-0 SH 27X BRD (SUTURE) ×2 IMPLANT
SUT VICRYL 3-0 CR8 SH (SUTURE) IMPLANT
SUT VICRYL 4-0 PS2 18IN ABS (SUTURE) ×6 IMPLANT
SUT VLOC 180 0 24IN GS25 (SUTURE) IMPLANT
SYR BULB IRRIGATION 50ML (SYRINGE) ×6 IMPLANT
SYR CONTROL 10ML LL (SYRINGE) ×4 IMPLANT
TAPE MEASURE VINYL STERILE (MISCELLANEOUS) ×2 IMPLANT
TISSUE ALLDRM 8X16 MED THICK (Tissue) IMPLANT
TISSUE EXPANDER MX 400CC (Breast) ×8 IMPLANT
TOWEL OR 17X24 6PK STRL BLUE (TOWEL DISPOSABLE) ×8 IMPLANT
TOWEL OR NON WOVEN STRL DISP B (DISPOSABLE) ×4 IMPLANT
TUBE CONNECTING 20'X1/4 (TUBING) ×2
TUBE CONNECTING 20X1/4 (TUBING) ×6 IMPLANT
UNDERPAD 30X30 (UNDERPADS AND DIAPERS) ×4 IMPLANT
YANKAUER SUCT BULB TIP NO VENT (SUCTIONS) ×6 IMPLANT

## 2017-02-15 NOTE — Progress Notes (Signed)
Emotional support during breast injections °

## 2017-02-15 NOTE — Interval H&P Note (Signed)
History and Physical Interval Note:  02/15/2017 10:12 AM  Anita Frazier  has presented today for surgery, with the diagnosis of LEFT BREAST DCIS  The various methods of treatment have been discussed with the patient and family. After consideration of risks, benefits and other options for treatment, the patient has consented to  Procedure(s): BILATERAL SKIN SPARING MASTECTOMIES WITH LEFT AXILLARY SENTINEL LYMPH NODE BIOPSY (Bilateral) BILATERAL BREAST RECONSTRUCTION WITH PLACEMENT OF TISSUE EXPANDER AND ALLODERM (Bilateral) as a surgical intervention .  The patient's history has been reviewed, patient examined, no change in status, stable for surgery.  I have reviewed the patient's chart and labs.  Questions were answered to the patient's satisfaction.     [name],[name]

## 2017-02-15 NOTE — Op Note (Signed)
Operative Note   DATE OF OPERATION: 8.9.18  LOCATION: [facility]-observation  SURGICAL DIVISION: Plastic Surgery  PREOPERATIVE DIAGNOSES:  1. Left breast DCIS   POSTOPERATIVE DIAGNOSES:  same  PROCEDURE:  1. Bilateral breast reconstruction with tissue expanders 2. Acellular dermis (Alloderm) for breast reconstruction 220 cm2  SURGEON: Irene Limbo MD MBA  ASSISTANT: none  ANESTHESIA:  General.   EBL: 102 ml for entire procedure  COMPLICATIONS: None immediate.   INDICATIONS FOR PROCEDURE:  The patient, Anita Frazier, is a 51 y.o. female born on 1966-05-08, is here for immediate expander based reconstruction following bilateral mastectomies.   FINDINGS: Natrelle 133MX-12-T 400 ml tissue expanders placed, initial fill volume 180 ml bilateral. RIGHT SN [id number] LEFT SN [id number]  DESCRIPTION OF PROCEDURE:  The patient was marked with the patient in the preoperative area to mark sternal notch, chest midline, anterior axillary lines and inframammary folds. The patient was taken to the operating room. SCDs were placed and IV antibiotics were given. Foley catheter placed. The patient's operative site was prepped and draped in a sterile fashion. A time out was performed and all information was confirmed to be correct. Following completion of mastectomies, reconstruction began on right side.  Lower border pectoralis muscle identified and dissected free from overlying skin flap in limited fashion. Submuscular dissection then completed to accommodate the expander. Acellular dermis prepared and perforated. This was inset to chest wall with running 3-0 vicryl. The cavity was irrigated with Ancef, gentamicin, bacitracin solution and hemostasis ensured. 19Fr JP placed in cavity and secured with 2-0 nylon. The cavity was then irrigated with Betadine. Expander prepared and placed in submuscular cavity. This was secured to chest wall with 3-0 vicryl. Acellular dermis was then wrapped over  lower border expander and secured to lateral pectoralis muscle with 3-0 vicryl. Laterally the mastectomy flap over posterior axillary line was advanced anteriorly and the subcutaneous tissue and superficial fascia was secured to pectoralis muscle and acellular dermis with 0-vicryl. Skin closure completedwith 3-0 vicryl in fascial layer and 4-0 vicryl in dermis. Skin closure completed with 4-0 monocryl subcuticular and tissue adhesive.  I then directed my attention to left chest where similar submuscular dissection completed. There perforated ADM was then secured to chestwall with running 3-0 vicryl. The cavity was irrigated with Ancef, gentamicin, bacitracin solution and hemostasis ensured. 19Fr JP placed in cavity and secured with 2-0 nylon. The cavity was then irrigated with Betadine. The expander was placed in submuscular position and secured to chest wall with 3-0 vicryl. The ADM was redraped over expander and trimmed. It was sutured to lower border pectoralis with 3-0 vicryl. Laterally the mastectomy flap over posterior axillary line was advanced anteriorly and the subcutaneous tissue and superficial fascia was secured to pectoralis muscle and acellular dermis with 0-vicryl. Skin closure completedwith 3-0 vicryl in fascial layer and 4-0 vicryl in dermis. Skin closure completed with 4-0 monocryl subcuticular and tissue adhesive. The ports were accessed and filled to 180 ml of saline on each side. Tissue adhesive applied.  Dry dressing and breast binder applied.  The patient was allowed to wake from anesthesia, extubated and taken to the recovery room in satisfactory condition.   SPECIMENS: none  DRAINS: 19 Fr JP in right and left breast reconstruction  Irene Limbo, MD [facility] Plastic & Reconstructive Surgery [phone], pin [phone]

## 2017-02-15 NOTE — Interval H&P Note (Signed)
History and Physical Interval Note:  02/15/2017 9:59 AM  Anita Frazier  has presented today for surgery, with the diagnosis of LEFT BREAST DCIS  The various methods of treatment have been discussed with the patient and family. After consideration of risks, benefits and other options for treatment, the patient has consented to  Procedure(s): BILATERAL SKIN SPARING MASTECTOMIES WITH LEFT AXILLARY SENTINEL LYMPH NODE BIOPSY (Bilateral) BILATERAL BREAST RECONSTRUCTION WITH PLACEMENT OF TISSUE EXPANDER AND ALLODERM (Bilateral) as a surgical intervention .  The patient's history has been reviewed, patient examined, no change in status, stable for surgery.  I have reviewed the patient's chart and labs.  Questions were answered to the patient's satisfaction.     Ramonte Mena

## 2017-02-15 NOTE — Transfer of Care (Signed)
Immediate Anesthesia Transfer of Care Note  Patient: Anita Frazier  Procedure(s) Performed: Procedure(s): BILATERAL SKIN SPARING MASTECTOMIES WITH LEFT AXILLARY SENTINEL LYMPH NODE BIOPSY (Bilateral) BILATERAL BREAST RECONSTRUCTION WITH PLACEMENT OF TISSUE EXPANDERS AND ALLODERM (Bilateral)  Patient Location: PACU  Anesthesia Type:General  Level of Consciousness: awake  Airway & Oxygen Therapy: Patient Spontanous Breathing and Patient connected to face mask oxygen  Post-op Assessment: Report given to RN and Post -op Vital signs reviewed and stable  Post vital signs: Reviewed and stable  Last Vitals:  Vitals:   02/15/17 0955 02/15/17 0956  BP:    Pulse: (!) 55 (!) 54  Resp: 11 14  Temp:    SpO2: 100% 100%    Last Pain:  Vitals:   02/15/17 0837  TempSrc: Oral      Patients Stated Pain Goal: 0 (14/43/60 1658)  Complications: No apparent anesthesia complications

## 2017-02-15 NOTE — Anesthesia Procedure Notes (Signed)
Procedure Name: Intubation Date/Time: 02/15/2017 10:41 AM Performed by: Lieutenant Diego Pre-anesthesia Checklist: Patient identified, Emergency Drugs available, Suction available and Patient being monitored Patient Re-evaluated:Patient Re-evaluated prior to induction Oxygen Delivery Method: Circle system utilized Preoxygenation: Pre-oxygenation with 100% oxygen Induction Type: IV induction Ventilation: Mask ventilation without difficulty Laryngoscope Size: Miller and 2 Grade View: Grade I Tube type: Oral Tube size: 7.0 mm Number of attempts: 1 Airway Equipment and Method: Stylet and Oral airway Placement Confirmation: ETT inserted through vocal cords under direct vision,  positive ETCO2 and breath sounds checked- equal and bilateral Secured at: 21 cm Tube secured with: Tape Dental Injury: Teeth and Oropharynx as per pre-operative assessment

## 2017-02-15 NOTE — Discharge Instructions (Signed)

## 2017-02-15 NOTE — Anesthesia Preprocedure Evaluation (Signed)
Anesthesia Evaluation  Patient identified by MRN, date of birth, ID band Patient awake    Reviewed: Allergy & Precautions, NPO status , Patient's Chart, lab work & pertinent test results  Airway Mallampati: II  TM Distance: >3 FB Neck ROM: Full    Dental no notable dental hx.    Pulmonary neg pulmonary ROS,    Pulmonary exam normal breath sounds clear to auscultation       Cardiovascular negative cardio ROS Normal cardiovascular exam Rhythm:Regular Rate:Normal     Neuro/Psych negative neurological ROS  negative psych ROS   GI/Hepatic negative GI ROS, Neg liver ROS,   Endo/Other  negative endocrine ROS  Renal/GU negative Renal ROS  negative genitourinary   Musculoskeletal negative musculoskeletal ROS (+)   Abdominal   Peds negative pediatric ROS (+)  Hematology negative hematology ROS (+)   Anesthesia Other Findings Breast Cancer  Reproductive/Obstetrics negative OB ROS                             Anesthesia Physical Anesthesia Plan  ASA: III  Anesthesia Plan: General   Post-op Pain Management:    Induction: Intravenous  PONV Risk Score and Plan: 3 and Ondansetron, Dexamethasone, Midazolam and Scopolamine patch - Pre-op  Airway Management Planned: Oral ETT  Additional Equipment:   Intra-op Plan:   Post-operative Plan: Extubation in OR  Informed Consent: I have reviewed the patients History and Physical, chart, labs and discussed the procedure including the risks, benefits and alternatives for the proposed anesthesia with the patient or authorized representative who has indicated his/her understanding and acceptance.   Dental advisory given  Plan Discussed with: CRNA  Anesthesia Plan Comments:         Anesthesia Quick Evaluation

## 2017-02-15 NOTE — Op Note (Signed)
02/15/2017  12:17 PM  PATIENT:  Anita Frazier  51 y.o. female  PRE-OPERATIVE DIAGNOSIS:  LEFT BREAST DCIS  POST-OPERATIVE DIAGNOSIS: LEFT BREAST DCIS  PROCEDURE:  Procedure(s): LEFT SKIN SPARING MASTECTOMY WITH DEEP LEFT AXILLARY SENTINEL LYMPH NODE BIOPSY RIGHT PROPHYLACTIC SKIN SPARING MASTECTOMY   SURGEON:  Surgeon(s) and Role: Panel 1:    * Jovita Kussmaul, MD - Primary  Panel 2:    * Irene Limbo, MD - Primary  PHYSICIAN ASSISTANT:   ASSISTANTS: Dr. Iran Planas   ANESTHESIA:   general  EBL:  Total I/O In: 1500 [I.V.:1500] Out: 200 [Urine:150; Blood:50]  BLOOD ADMINISTERED:none  DRAINS: none   LOCAL MEDICATIONS USED:  NONE  SPECIMEN:  Source of Specimen:  right mastectomy and left mastectomy with sentinel nodes X 2  DISPOSITION OF SPECIMEN:  PATHOLOGY  COUNTS:  YES  TOURNIQUET:  * No tourniquets in log *  DICTATION: .Dragon Dictation   After informed consent was obtained the patient was brought to the operating room and placed in the supine position on the operating room table. After adequate induction of general anesthesia the patient's bilateral chest, breast, and axillary areas were prepped with ChloraPrep, allowed to dry, draped in usual sterile manner. An appropriate timeout was performed. Attention was first turned to the right breast which was prophylactic. An elliptical incision was made around the nipple and areola complex in order to spare as much skin as possible. The incision was carried through the skin and subcutaneous tissue sharply with the plasma blade. Breast hooks were then used to elevate the skin flaps anteriorly towards the ceiling. Thin skin flaps were then created circumferentially between the breast tissue in the subcutaneous fat. This dissection was carried all the way to the chest wall circumferentially. Next the breast was removed from the pectoralis muscle with the pectoralis fascia. Once the breast was removed it was oriented with a  stitch on the lateral skin and sent to pathology for further evaluation. Hemostasis was achieved using the plasma blade. The wound was then packed with a moistened lap sponge. Attention was then turned to the left breast. Earlier in the day the patient underwent injection of 1 mCi of technetium sulfur colloid in the subareolar area of the left breast. The neoprobe was set to technetium and an area of increased radioactivity was identified in the left axilla and marked on the skin. A similar elliptical incision was made around the nipple and areola complex in order to spare as much skin as possible. The incision was carried through the skin and subcutaneous tissue sharply with the plasma blade. Breast hooks were then used to elevate the skin flaps anteriorly towards the ceiling. Thin skin flaps were created circumferentially between the breast tissue and the subcutaneous fat. This dissection was carried all the way to the chest wall. Next the breast was removed from the pectoralis muscle with the pectoralis fascia. Once the breast was removed it was oriented with a stitch on the lateral skin and sent to pathology for further evaluation. The neoprobe was then used to direct blunt hemostat dissection into the deep left axillary space. I was able to identify 2 lymph nodes in the space behind the pectoralis muscle with increased radioactivity. These lymph nodes were excised sharply with the plasma blade. Ex vivo counts on sentinel node #1 were approximately 3000 and on sentinel node #2 approximately 300. These were sent to pathology for further evaluation. Hemostasis was achieved using the plasma blade. The wound was then packed  with a moistened lap sponge. The skin flaps appeared healthy. The patient tolerated the procedure well. At the this point the case all needle sponge and instrument counts were correct. The operation was then turned over to Dr. Iran Planas for the reconstruction. Her portion of the case will be  dictated separately.  PLAN OF CARE: Admit for overnight observation  PATIENT DISPOSITION:  PACU - hemodynamically stable.   Delay start of Pharmacological VTE agent (>24hrs) due to surgical blood loss or risk of bleeding: no

## 2017-02-15 NOTE — H&P (Signed)
Anita Frazier  Location: [facility] Patient #: [id number] DOB: 09-Sep-1965 Married / Language: English / Race: White Female   History of Present Illness  The patient is a 51 year old female who presents with breast cancer. We are asked to see the patient in consultation by Dr. Arma Heading to evaluate her for a new left breast cancer. The patient is a 51 year old white female who recently went for a routine screening mammogram. At that time she was found to have a 5 cm area of calcification in the upper outer left breast that was felt to track fairly close to the nipple. This was biopsied and the pathology came back as ductal carcinoma in situ. There was no areas of diagnostic invasion but there was some suspicion that there could be some present. The tumor markers have not yet been reported. She denied any pain in the breast or discharge from the nipple. She is not a smoker. She does have a family history of breast cancer in 2 maternal aunts. She does not take any hormone replacement.   Problem List/Past Medical  DUCTAL CARCINOMA IN SITU (DCIS) OF LEFT BREAST (D05.12)   Past Surgical History Knee Surgery  Left.  Diagnostic Studies History  Colonoscopy  never  Allergies  No Known Drug Allergies Allergies Reconciled   Medication History  No Current Medications Medications Reconciled  Social History  Alcohol use  Moderate alcohol use. Caffeine use  Coffee. No drug use  Tobacco use  Never smoker.  Family History  Heart Disease  Father. Hypertension  Father, Mother. Migraine Headache  Daughter, Sister.  Other Problems Heart murmur  Lump In Breast     Review of Systems General Not Present- Appetite Loss, Chills, Fatigue, Fever, Night Sweats, Weight Gain and Weight Loss. Note: All other systems negative (unless as noted in HPI & included Review of Systems) Skin Not Present- Change in Wart/Mole, Dryness, Hives, Jaundice, New Lesions, Non-Healing  Wounds, Rash and Ulcer. HEENT Not Present- Earache, Hearing Loss, Hoarseness, Nose Bleed, Oral Ulcers, Ringing in the Ears, Seasonal Allergies, Sinus Pain, Sore Throat, Visual Disturbances, Wears glasses/contact lenses and Yellow Eyes. Respiratory Not Present- Bloody sputum, Chronic Cough, Difficulty Breathing, Snoring and Wheezing. Breast Not Present- Breast Mass, Breast Pain, Nipple Discharge and Skin Changes. Cardiovascular Not Present- Chest Pain, Difficulty Breathing Lying Down, Leg Cramps, Palpitations, Rapid Heart Rate, Shortness of Breath and Swelling of Extremities. Gastrointestinal Not Present- Abdominal Pain, Bloating, Bloody Stool, Change in Bowel Habits, Chronic diarrhea, Constipation, Difficulty Swallowing, Excessive gas, Gets full quickly at meals, Hemorrhoids, Indigestion, Nausea, Rectal Pain and Vomiting. Musculoskeletal Not Present- Back Pain, Joint Pain, Joint Stiffness, Muscle Pain, Muscle Weakness and Swelling of Extremities. Neurological Not Present- Decreased Memory, Fainting, Headaches, Numbness, Seizures, Tingling, Tremor, Trouble walking and Weakness. Psychiatric Not Present- Anxiety, Bipolar, Change in Sleep Pattern, Depression, Fearful and Frequent crying. Endocrine Not Present- Cold Intolerance, Excessive Hunger, Hair Changes, Heat Intolerance and New Diabetes. Hematology Not Present- Easy Bruising, Excessive bleeding, Gland problems, HIV and Persistent Infections.  Vitals  Weight: 110.8 lb Height: 60in Body Surface Area: 1.45 m Body Mass Index: 21.64 kg/m  Temp.: 89F  Pulse: 82 (Regular)  P.OX: 97% (Room air) BP: 170/110 (Sitting, Left Arm, Standard)       Physical Exam General Mental Status-Alert. General Appearance-Consistent with stated age. Hydration-Well hydrated. Voice-Normal.  Head and Neck Head-normocephalic, atraumatic with no lesions or palpable masses. Trachea-midline. Thyroid Gland Characteristics - normal size and  consistency.  Eye Eyeball - Bilateral-Extraocular movements intact.  Sclera/Conjunctiva - Bilateral-No scleral icterus.  Chest and Lung Exam Chest and lung exam reveals -quiet, even and easy respiratory effort with no use of accessory muscles and on auscultation, normal breath sounds, no adventitious sounds and normal vocal resonance. Inspection Chest Wall - Normal. Back - normal.  Breast Note: There is no palpable mass in either breast. There is no palpable axillary, supraclavicular, or cervical lymphadenopathy.   Cardiovascular Cardiovascular examination reveals -normal heart sounds, regular rate and rhythm with no murmurs and normal pedal pulses bilaterally.  Abdomen Inspection Inspection of the abdomen reveals - No Hernias. Skin - Scar - no surgical scars. Palpation/Percussion Palpation and Percussion of the abdomen reveal - Soft, Non Tender, No Rebound tenderness, No Rigidity (guarding) and No hepatosplenomegaly. Auscultation Auscultation of the abdomen reveals - Bowel sounds normal.  Neurologic Neurologic evaluation reveals -alert and oriented x 3 with no impairment of recent or remote memory. Mental Status-Normal.  Musculoskeletal Normal Exam - Left-Upper Extremity Strength Normal and Lower Extremity Strength Normal. Normal Exam - Right-Upper Extremity Strength Normal and Lower Extremity Strength Normal.  Lymphatic Head & Neck  General Head & Neck Lymphatics: Bilateral - Description - Normal. Axillary  General Axillary Region: Bilateral - Description - Normal. Tenderness - Non Tender. Femoral & Inguinal  Generalized Femoral & Inguinal Lymphatics: Bilateral - Description - Normal. Tenderness - Non Tender.    Assessment & Plan  DUCTAL CARCINOMA IN SITU (DCIS) OF LEFT BREAST (D05.12) Impression: The patient appears to have a large area of DCIS covering approximately 4-1/2 cm in the upper outer left breast. Because of the size of the area involved I  have talked to her about the different options for treatment and I feel that mastectomy may be her best option. She would also be a good candidate for sentinel node mapping since her lymph nodes are clinically negative. She would be a candidate for reconstruction as well. At this point I will obtain an MRI study of both breast given that her breast density is described as extremely dense and because of her ductal carcinoma in situ diagnosis. I will refer her to medical and radiation oncology as well as plastic surgery. We will await the report on the rest of her tumor markers and then we will create a plan for her moving forward. She will call us if she has any further questions. I will discuss her treatment with the rest of the doctors on the team once everyone has had a chance to evaluate her. Current Plans Referred to Oncology, for evaluation and follow up (Oncology). Routine. Referred to Surgery - Plastic, for evaluation and follow up (Plastic Surgery). Routine. MRI, BOTH BREASTS ([id number])  She has elected for left mastectomy and sentinel node mapping and right prophylactic mastectomy with reconstruction.

## 2017-02-15 NOTE — Anesthesia Postprocedure Evaluation (Signed)
Anesthesia Post Note  Patient: Anita Frazier  Procedure(s) Performed: Procedure(s) (LRB): BILATERAL SKIN SPARING MASTECTOMIES WITH LEFT AXILLARY SENTINEL LYMPH NODE BIOPSY (Bilateral) BILATERAL BREAST RECONSTRUCTION WITH PLACEMENT OF TISSUE EXPANDERS AND ALLODERM (Bilateral)     Patient location during evaluation: PACU Anesthesia Type: General Level of consciousness: awake and alert Pain management: pain level controlled Vital Signs Assessment: post-procedure vital signs reviewed and stable Respiratory status: spontaneous breathing, nonlabored ventilation and respiratory function stable Cardiovascular status: blood pressure returned to baseline and stable Postop Assessment: no signs of nausea or vomiting Anesthetic complications: no    Last Vitals:  Vitals:   02/15/17 1445 02/15/17 1500  BP: 140/71 (!) 150/70  Pulse:  63  Resp: 19 13  Temp:    SpO2:  97%    Last Pain:  Vitals:   02/15/17 1530  TempSrc:   PainSc: [place]

## 2017-02-16 ENCOUNTER — Encounter (HOSPITAL_BASED_OUTPATIENT_CLINIC_OR_DEPARTMENT_OTHER): Payer: Self-pay | Admitting: General Surgery

## 2017-02-16 DIAGNOSIS — Z853 Personal history of malignant neoplasm of breast: Secondary | ICD-10-CM | POA: Diagnosis not present

## 2017-02-16 DIAGNOSIS — D0512 Intraductal carcinoma in situ of left breast: Secondary | ICD-10-CM | POA: Diagnosis not present

## 2017-02-16 DIAGNOSIS — Z9013 Acquired absence of bilateral breasts and nipples: Secondary | ICD-10-CM | POA: Diagnosis not present

## 2017-02-23 ENCOUNTER — Encounter (HOSPITAL_BASED_OUTPATIENT_CLINIC_OR_DEPARTMENT_OTHER): Payer: Self-pay | Admitting: General Surgery

## 2017-02-28 ENCOUNTER — Ambulatory Visit (HOSPITAL_BASED_OUTPATIENT_CLINIC_OR_DEPARTMENT_OTHER): Payer: 59 | Admitting: Oncology

## 2017-02-28 VITALS — BP 148/70 | HR 67 | Temp 98.3°F | Resp 18 | Ht 60.0 in | Wt 112.9 lb

## 2017-02-28 DIAGNOSIS — D0512 Intraductal carcinoma in situ of left breast: Secondary | ICD-10-CM | POA: Diagnosis not present

## 2017-02-28 DIAGNOSIS — Z171 Estrogen receptor negative status [ER-]: Secondary | ICD-10-CM | POA: Diagnosis not present

## 2017-02-28 DIAGNOSIS — C50412 Malignant neoplasm of upper-outer quadrant of left female breast: Secondary | ICD-10-CM

## 2017-02-28 NOTE — Progress Notes (Signed)
[place]  [phone] [phone]     ID: PIERCE BIAGINI DOB: 26-Jul-1965  MR#: [id number]  JJH#:[id number]  Patient Care Team: Delsa Bern, MD as PCP - General (Obstetrics and Gynecology) Shannara Winbush, Virgie Dad, MD as Consulting Physician (Oncology) Jovita Kussmaul, MD as Consulting Physician (General Surgery) Eppie Gibson, MD as Attending Physician (Radiation Oncology) Irene Limbo, MD as Consulting Physician (Plastic Surgery) Chauncey Cruel, MD OTHER MD:  CHIEF COMPLAINT: Estrogen receptor negative breast cancer  CURRENT TREATMENT: Observation   BREAST CANCER HISTORY: From the original intake note:  Nzinga had screening mammography 12/06/2016 at Dr Boyd Kerbs suggesting a possible abnormality in the left breast upper outer quadrant. Left diagnostic mammography at [facility] 12/07/2016 found the breast density to be category D. In the upper-outer quadrant of the left breast there was 4.3 cm area of pleomorphic calcifications. Left breast ultrasonography 12/11/2016 found a mass in the area in question measuring 4.5 cm. The left axilla was sonographically benign.  Biopsy of the left breast upper outer quadrant mass 12/11/2016 showed ([id number]) ductal carcinoma in situ, high-grade, with areas highly suspicious for invasion. There are 2 prognstic panels, both estrogen and progesterone receptor negative, with MIB-1 between 10 and 15%. The tumor was HER-2 amplified, with a signals ratio 4.25 and the number per cell 9.35.  The patient's subsequent history is as detailed below.  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 Since her last visit here she underwent bilateral mastectomies with left sided sentinel lymph node sampling. The final pathology ([id number]), showed on the right, only fibrocystic change. On the left there was a microinvasive ductal carcinoma measuring less  than a millimeter, in a background of high-grade ductal carcinoma in situ. Margins were negative. Both left axillary lymph nodes were negative.  REVIEW OF SYSTEMS: She has significant pain postop but that is now much better. The expanders appear to be working well. A detailed review of systems today was otherwise stable  PAST MEDICAL HISTORY: Past Medical History:  Diagnosis Date  . Abnormal Pap smear 1992 & 3/99   LEEP  . ASCUS (atypical squamous cells of undetermined significance) on Pap smear 08/1997  . Breast cancer ([place])   . 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 Dysplasia of cervix, low grade (CIN 1)   . Factor 5 Leiden mutation, heterozygous ([place]) 06/09/04  . Family history of breast cancer   . Irregular menses 01/2001  . Menstrual period late 11/17/10  . Pelvic pain 12/05/06    PAST SURGICAL HISTORY: Past Surgical History:  Procedure Laterality Date  . ARTHROSCOPIC REPAIR ACL Left 2008  . BREAST RECONSTRUCTION WITH PLACEMENT OF TISSUE EXPANDER AND FLEX HD (ACELLULAR HYDRATED DERMIS) Bilateral 02/15/2017   Procedure: BILATERAL BREAST RECONSTRUCTION WITH PLACEMENT OF TISSUE EXPANDERS AND ALLODERM;  Surgeon: Irene Limbo, MD;  Location: [facility];  Service: Plastics;  Laterality: Bilateral;  . LEEP  1992  . MASTECTOMY W/ SENTINEL NODE BIOPSY Bilateral 02/15/2017   Procedure: BILATERAL SKIN SPARING MASTECTOMIES WITH LEFT AXILLARY SENTINEL LYMPH NODE BIOPSY;  Surgeon: Jovita Kussmaul, MD;  Location: [facility];  Service: General;  Laterality: Bilateral;    FAMILY HISTORY Family History  Problem Relation Age of Onset  . Heart disease Father 32       MI  . Breast cancer Maternal Aunt 41       now is 97  . Hypertension Mother  chronic  . COPD Son        Respiratory affective disease   . Diabetes Maternal Grandmother   . Cancer Maternal Grandmother 60       type unknown  . Breast cancer Maternal Aunt 70       now is 3    The patient's parents are living as of June 2018, her father being 67 and her mother 7. The patient's mother is 43 sisters, 2 of whom had breast cancer, one at age 76, one at age 25. The patient had no brothers, 1 sister. There is no history of ovarian cancer in the family.  GYNECOLOGIC HISTORY:  Patient's last menstrual period was 02/14/2012. Menarche age 75, first live birth age 6, she is GX P3, including a per of twins). She stopped having periods approximately 2015. She did not use hormone replacement. She used oral contraceptives for 2 years remotely with no complications but no history of factor V Leiden  SOCIAL HISTORY:  Kirah works as an Nurse, children's. Her husband, Palma Holter who works in [employer] for [employer]. Their children are:, 20, studying business at [employer] and still at home, and the twins [place] and [place] Ma , 18, who will be freshman at [employer] August 2018. The patient attends a local [place]:  in place. The patient's husband is her healthcare part of attorney    HEALTH MAINTENANCE: Social History  Substance Use Topics  . Smoking status: Never Smoker  . Smokeless tobacco: Never Used  . Alcohol use Yes     Comment: 2-3 drinks weekly     Colonoscopy:Never  PAP:  Bone density:   No Known Allergies  Current Outpatient Prescriptions  Medication Sig Dispense Refill  . HYDROcodone-acetaminophen (NORCO) 5-325 MG tablet Take 1-2 tablets by mouth every 6 (six) hours as needed for moderate pain. 40 tablet 0  . methocarbamol (ROBAXIN) 500 MG tablet Take 1 tablet (500 mg total) by mouth every 8 (eight) hours as needed for muscle spasms. 30 tablet 0  . sulfamethoxazole-trimethoprim (BACTRIM DS,SEPTRA DS) 800-160 MG tablet Take 1 tablet by mouth 2 (two) times daily. 12 tablet 0   No current facility-administered medications for this visit.     OBJECTIVE:Middle-aged white womanWho appears stated age  51:   02/28/17 1317   BP: (!) 148/70  Pulse: 67  Resp: 18  Temp: 98.3 F (36.8 C)  SpO2: 98%     Body mass index is 22.05 kg/m.  Filed Weights   02/28/17 1317  Weight: 112 lb 14.4 oz (51.2 kg)      ECOG FS:1 - Symptomatic but completely ambulatory  Sclerae unicteric, pupils round and equal Oropharynx clear and moist No cervical or supraclavicular adenopathy Lungs no rales or rhonchi Heart regular rate and rhythm Abd soft, nontender, positive bowel sounds MSK no focal spinal tenderness, no upper extremity lymphedema Neuro: nonfocal, well oriented, appropriate affect Breasts: Status post bilateral mastectomies with expanders in place. The cosmetic result is good. There is no evidence of residual or recurrent disease. Both axillae are benign.   LAB RESULTS:  CMP     Component Value Date/Time   NA 143 01/08/2017 1524   K 4.1 01/08/2017 1524   CO2 28 01/08/2017 1524   GLUCOSE 99 01/08/2017 1524   BUN 14.5 01/08/2017 1524   CREATININE 0.8 01/08/2017 1524   CALCIUM 10.3 01/08/2017 1524   PROT 7.1 01/08/2017 1524   ALBUMIN 4.1 01/08/2017 1524   AST 20  01/08/2017 1524   ALT 17 01/08/2017 1524   ALKPHOS 42 01/08/2017 1524   BILITOT 0.97 01/08/2017 1524    No results found for: TOTALPROTELP, ALBUMINELP, A1GS, A2GS, BETS, BETA2SER, GAMS, MSPIKE, SPEI  No results found for: Nils Pyle, [facility]  Lab Results  Component Value Date   WBC 7.7 01/08/2017   NEUTROABS 5.7 01/08/2017   HGB 13.2 02/15/2017   HCT 42.3 01/08/2017   MCV 95.4 01/08/2017   PLT 185 01/08/2017      Chemistry      Component Value Date/Time   NA 143 01/08/2017 1524   K 4.1 01/08/2017 1524   CO2 28 01/08/2017 1524   BUN 14.5 01/08/2017 1524   CREATININE 0.8 01/08/2017 1524      Component Value Date/Time   CALCIUM 10.3 01/08/2017 1524   ALKPHOS 42 01/08/2017 1524   AST 20 01/08/2017 1524   ALT 17 01/08/2017 1524   BILITOT 0.97 01/08/2017 1524       No results found for: LABCA2  No  components found for: [id number]  No results for input(s): INR in the last 168 hours.  Urinalysis No results found for: COLORURINE, APPEARANCEUR, LABSPEC, PHURINE, GLUCOSEU, HGBUR, BILIRUBINUR, KETONESUR, PROTEINUR, UROBILINOGEN, NITRITE, LEUKOCYTESUR   STUDIES: No results found.  ELIGIBLE FOR AVAILABLE RESEARCH PROTOCOL: no  ASSESSMENT: 51 y.o.  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 (See discussion below)  (2) status post bilateral mastectomies, showing  (a) on the right, no evidence of disease  (b) on the left, ductal carcinoma in situ, extensive, with negative margins. There was a microinvasive ductal carcinoma, less than a millimeter., final pathology pT1)mic) pN0  (3) No additional breast cancer risk reduction therapy needed  PLAN: I spent approximately 30 minutes with Magda Paganini going over her multiple questions and concerns.  Tiffony is pretty much done with breast cancer. She understands that noninvasive breast cancer is essentially cured with mastectomy. The microinvasive lesion she had in the left breast requires no further treatment: The risk of 8 having spread outside the breast is vanishingly small.  She is concerned about her MUTYH heterozygous mutation. This was discussed with our genetics counselors and a summary is added below. A referral to [facility] was suggested.  She is planning to have a colonoscopy through the [facility] group in the near future  Otherwise she will return to see me in one year. She knows to call for any problems that may develop before that visit.  Chauncey Cruel, MD   02/28/2017 1:29  PM Medical Oncology and Hematology [facility] [address] [place], [place] [address] Tel. [phone]    Fax. [phone]  The MUTYH gene is associated with autosomal recessive MUTYH-associated polyposis (MAP) (MedGen UID: [id number]). Additionally, the MUTYH gene has preliminary evidence supporting a correlation with breast cancer (PMID: 55732202, 54270623) and several other cancer types (PMID: 76283151, 76160737, 10626948). . Because MUTYH-related conditions are autosomal recessive, one pathogenic variant alone may not explain this individual's reported condition and/or family history. The carrier frequency for a single pathogenic MUTYH variant in the [place] population is 1.5 to 2% (PMID: 54627035, 00938182). The risk for colon cancer in heterozygotes was found to be only marginally increased  in large population-based studies (OR: 1.07-1.2) (PMID: 79024097, 35329924); whereas, family-based studies found a higher risk to heterozygote MUTYH family members than in the general population (OR: 2-3) (PMID: 26834196, 22297989, 21194174). Carrier status does impact reproductive risk. Clinical management guidelines can be found at https://www.martin.info/. . Close relatives (children, siblings and each parent) have up to a 50% chance of being a carrier of this variant. The chance of having a child affected with autosomal recessive MUTYH-associated polyposis (MAP) depends on the carrier state of this individual's partner. When two parents each carry a Pathogenic variant, their chance of having an affected child is 25%. More distant relatives may also be carriers. Testing for this variant is available.

## 2017-03-05 ENCOUNTER — Encounter: Payer: Self-pay | Admitting: *Deleted

## 2017-03-07 ENCOUNTER — Telehealth: Payer: Self-pay | Admitting: Adult Health

## 2017-03-07 NOTE — Telephone Encounter (Signed)
Scheduled SCP per 8/27 sch message - sent reminder letter in the mail.

## 2017-03-08 ENCOUNTER — Ambulatory Visit: Payer: 59 | Attending: Plastic Surgery | Admitting: Physical Therapy

## 2017-03-08 ENCOUNTER — Encounter: Payer: Self-pay | Admitting: Physical Therapy

## 2017-03-08 DIAGNOSIS — M25612 Stiffness of left shoulder, not elsewhere classified: Secondary | ICD-10-CM

## 2017-03-08 DIAGNOSIS — M6281 Muscle weakness (generalized): Secondary | ICD-10-CM

## 2017-03-08 DIAGNOSIS — M25611 Stiffness of right shoulder, not elsewhere classified: Secondary | ICD-10-CM

## 2017-03-08 DIAGNOSIS — R293 Abnormal posture: Secondary | ICD-10-CM | POA: Diagnosis not present

## 2017-03-08 NOTE — Patient Instructions (Signed)
Shoulder: Flexion (Supine)    With hands shoulder width apart, slowly lower dowel to floor behind head. Do not let elbows bend. Keep back flat. Hold _10-15___ seconds. Repeat _10___ times. Do ___2_ sessions per day. CAUTION: Stretch slowly and gently.  Copyright  VHI. All rights reserved.  Shoulder: Abduction (Supine)    With right arm flat on floor, hold dowel in palm. Slowly move arm up to side of head by pushing with opposite arm. Do not let elbow bend. Repeat on left side.  Hold _10-15___ seconds. Repeat __10__ times. Do __2__ sessions per day. CAUTION: Stretch slowly and gently.  Copyright  VHI. All rights reserved.

## 2017-03-08 NOTE — Therapy (Signed)
[place], [place], [address] Phone: [phone]   Fax:  [phone]  Physical Therapy Treatment  Patient Details  Name: Anita Frazier MRN: [id number] Date of Birth: Jul 29, 1965 Referring Provider: Dr. Iran Planas  Encounter Date: 03/08/2017      PT End of Session - 03/08/17 1147    Visit Number 1   Number of Visits 9   Date for PT Re-Evaluation 04/05/17   PT Start Time 1055   PT Stop Time 1140   PT Time Calculation (min) 45 min   Activity Tolerance Patient tolerated treatment well   Behavior During Therapy [facility] for tasks assessed/performed      Past Medical History:  Diagnosis Date  . Abnormal Pap smear 1992 & 3/99   LEEP  . ASCUS (atypical squamous cells of undetermined significance) on Pap smear 08/1997  . Breast cancer ([place])   . 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 Dysplasia of cervix, low grade (CIN 1)   . Factor 5 Leiden mutation, heterozygous ([place]) 06/09/04  . Family history of breast cancer   . Irregular menses 01/2001  . Menstrual period late 11/17/10  . Pelvic pain 12/05/06    Past Surgical History:  Procedure Laterality Date  . ARTHROSCOPIC REPAIR ACL Left 2008  . BREAST RECONSTRUCTION WITH PLACEMENT OF TISSUE EXPANDER AND FLEX HD (ACELLULAR HYDRATED DERMIS) Bilateral 02/15/2017   Procedure: BILATERAL BREAST RECONSTRUCTION WITH PLACEMENT OF TISSUE EXPANDERS AND ALLODERM;  Surgeon: Irene Limbo, MD;  Location: [facility];  Service: Plastics;  Laterality: Bilateral;  . LEEP  1992  . MASTECTOMY W/ SENTINEL NODE BIOPSY Bilateral 02/15/2017   Procedure: BILATERAL SKIN SPARING MASTECTOMIES WITH LEFT AXILLARY SENTINEL LYMPH NODE BIOPSY;  Surgeon: Jovita Kussmaul, MD;  Location: [facility];  Service: General;  Laterality: Bilateral;    There were no vitals filed for this visit.      Subjective Assessment - 03/08/17 1101    Subjective I had  bilateral mastectomies on 02/15/17 and I had a sentinel lymph node biopsy on the left. Both shoulders are tight. I just had my expanders filled this morning. My husband says my posture is hunched forward. I have trouble getting dressed. Getting stuff over head. My range of motion is nowhere near what it was.    Pertinent History Biopsy of the left breast upper outer quadrant mass 12/11/2016 showed ([id number]) ductal carcinoma in situ, high-grade, with areas highly suspicious for invasion. There are 2 prognstic panels, both estrogen and progesterone receptor negative, with MIB-1 between 10 and 15%. The tumor was HER-2 amplified, with a signals ratio 4.25 and the number per cell 9.35, s/p 02/15/17 bilateral mastectomies with L SNLB,   Patient Stated Goals to return to my normal ROM   Currently in Pain? Yes   Pain Score 1    Pain Location Breast   Pain Orientation Right;Left   Pain Descriptors / Indicators Discomfort   Pain Type Surgical pain   Pain Onset 1 to 4 weeks ago   Pain Frequency Constant   Aggravating Factors  laying down   Pain Relieving Factors sit up   Effect of Pain on Daily Activities unable to sleep lying down            [facility] PT Assessment - 03/08/17 0001      Assessment   Medical Diagnosis left breast cancer   Referring Provider Dr. Iran Planas   Onset Date/Surgical Date 02/15/17   Hand Dominance  Right   Prior Therapy none     Precautions   Precautions Other (comment)  at risk of lymphedema     Restrictions   Weight Bearing Restrictions No     Balance Screen   Has the patient fallen in the past 6 months No   Has the patient had a decrease in activity level because of a fear of falling?  No   Is the patient reluctant to leave their home because of a fear of falling?  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, she drives a lot and has to take her equipment in to the school, she rolls it in a cart but has to lift it out of trunk   Leisure pt runs 3-4x/wk, 5-7 miles     Cognition   Overall Cognitive Status Within Functional Limits for tasks assessed     ROM / Strength   AROM / PROM / Strength AROM     AROM   Overall AROM  Deficits   AROM Assessment Site Shoulder   Right/Left Shoulder Right;Left   Right Shoulder Flexion 131 Degrees   Right Shoulder ABduction 105 Degrees   Right Shoulder Internal Rotation 75 Degrees   Right Shoulder External Rotation 86 Degrees   Left Shoulder Flexion 129 Degrees   Left Shoulder ABduction 104 Degrees   Left Shoulder Internal Rotation 58 Degrees   Left Shoulder External Rotation 65 Degrees           LYMPHEDEMA/ONCOLOGY QUESTIONNAIRE - 03/08/17 1114      Type   Cancer Type left breast cancer     Surgeries   Mastectomy Date 02/15/17   Sentinel Lymph Node Biopsy Date 02/15/17   Number Lymph Nodes Removed 2     Treatment   Active Chemotherapy Treatment No   Past Chemotherapy Treatment No   Active Radiation Treatment No   Past Radiation Treatment No   Current Hormone Treatment No   Past Hormone Therapy No     What other symptoms do you have   Are you Having Heaviness or Tightness Yes   Are you having Pain No   Are you having pitting edema No   Is it Hard or Difficult finding clothes that fit No   Do you have infections No   Is there Decreased scar mobility Yes     Lymphedema Assessments   Lymphedema Assessments Upper extremities     Right Upper Extremity Lymphedema   15 cm Proximal to Olecranon Process  26.5 cm   10 cm Proximal to Olecranon Process 24.1 cm   Olecranon Process 20.5 cm   15 cm Proximal to Ulnar Styloid Process 20.5 cm   10 cm Proximal to Ulnar Styloid Process 17.5 cm   Just Proximal to Ulnar Styloid Process 13.5 cm   Across Hand at [employer] 17.5 cm   At [place] of 2nd Digit 5.6 cm     Left Upper Extremity Lymphedema   15  cm Proximal to Olecranon Process 25.5 cm   10 cm Proximal to Olecranon Process 24 cm   Olecranon Process 20 cm   15 cm Proximal to Ulnar Styloid Process 20 cm   10 cm Proximal to Ulnar Styloid Process 17 cm   Just Proximal to Ulnar Styloid Process 13.5 cm   Across Hand at [employer] 16.5 cm   At [place] of 2nd Digit 5.5 cm                  [facility] Adult PT Treatment/Exercise - 03/08/17 0001      Shoulder Exercises: Supine   Flexion AAROM;Both;10 reps;Other (comment)  with dowel with 5 sec holds   ABduction Strengthening;Both;10 reps;Other (comment)  with dowel with 5 sec holds   Other Supine Exercises post op breast cancer exercises                PT Education - 03/08/17 1146    Education provided Yes   Education Details anatomy and physiology of lymphatic system and lymphedema risk reduction practices   Person(s) Educated Patient   Methods Explanation;Handout   Comprehension Verbalized understanding                [place] - 03/08/17 1157      CC Long Term Goal  #1   Title Pt to demonstrate 170 degrees of bilateral shoulder flexion to allow her to reach items overhead.   Baseline R 131, L 129   Time 4   Period Weeks   Status New   Target Date 04/05/17     CC Long Term Goal  #2   Title Pt to demonstrate 170 degrees of bilateral shoulder abduction to allow pt to reach out to sides.   Baseline R 105, L 104   Time 4   Period Weeks   Status New   Target Date 04/05/17     CC Long Term Goal  #3   Title Pt to demonstrate 85 degrees of left shoulder external rotation to allow her to return  to prior level of function.   Baseline 65   Time 4   Period Weeks   Status New   Target Date 04/05/17     CC Long Term Goal  #4   Title Pt to be able to independently verbalize lymphedema risk reduction practices   Time 4   Period Weeks   Status New   Target Date 04/05/17     CC Long Term Goal  #5   Title Pt to be independent in a home exercise program for continued strengthening and stretching   Time 4   Period Weeks   Status New            Plan - 03/08/17 1147    Clinical Impression Statement Pt present to PT with decreased bilateral shoulder ROM and bilateral shoulder weakness following a bilateral mastectomy with SLNB on L for treatment of left breast cancer. Pt is having difficulty reaching overhead and dressing herself due to decreased bilateral shoulder ROM. She was educated today on lymphedema risk reduction practices and issued pt HEP for bilateral shoulder stretching. Pt would benefit from skilled PT services to increase bilateral shoulder ROM and strength and to improve posture to allow improved function.    History and Personal Factors relevant to plan of care: pt is right handed   Clinical Presentation Stable   Clinical Presentation due to: n/a   Clinical Decision Making Low  Rehab Potential Excellent   PT Frequency 2x / week   PT Duration 4 weeks   PT Treatment/Interventions ADLs/Self Care Home Management;Therapeutic exercise;Patient/family education;Passive range of motion;Scar mobilization;Manual techniques   PT Next Visit Plan assess indep with supine dowel and post op exercises, begin gentle AA/A/PROM to bilateral shoulders   PT Home Exercise Plan post op breast cancer, supine dowel   Consulted and Agree with Plan of Care Patient      Patient will benefit from skilled therapeutic intervention in order to improve the following deficits and impairments:  Decreased knowledge of precautions, Decreased range of motion, Decreased strength, Impaired UE functional  use, Postural dysfunction, Decreased scar mobility, Increased fascial restricitons  Visit Diagnosis: Stiffness of left shoulder, not elsewhere classified - Plan: PT plan of care cert/re-cert  Stiffness of right shoulder, not elsewhere classified - Plan: PT plan of care cert/re-cert  Abnormal posture - Plan: PT plan of care cert/re-cert  Muscle weakness (generalized) - Plan: PT plan of care cert/re-cert     Problem List Patient Active Problem List   Diagnosis Date Noted  . 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 Genetic testing 01/17/2017  . Family history of breast cancer 01/11/2017  . Carcinoma of upper-outer quadrant of left breast in female, estrogen receptor negative ([place]) 12/27/2016  . Factor 5 Leiden mutation, heterozygous ([place])   . Dysplasia of cervix, low grade (CIN 1)     Allyson Sabal [facility] 03/08/2017, 12:01 PM  [place], [place], [address] Phone: [phone]   Fax:  [phone]  Name: LYDIA TOREN MRN: [id number] Date of Birth: 1965/07/13  Manus Gunning, PT 03/08/17 12:01 PM

## 2017-03-13 ENCOUNTER — Ambulatory Visit: Payer: 59 | Attending: Plastic Surgery

## 2017-03-13 DIAGNOSIS — M25612 Stiffness of left shoulder, not elsewhere classified: Secondary | ICD-10-CM | POA: Insufficient documentation

## 2017-03-13 DIAGNOSIS — R293 Abnormal posture: Secondary | ICD-10-CM | POA: Diagnosis not present

## 2017-03-13 DIAGNOSIS — M6281 Muscle weakness (generalized): Secondary | ICD-10-CM | POA: Insufficient documentation

## 2017-03-13 DIAGNOSIS — M25611 Stiffness of right shoulder, not elsewhere classified: Secondary | ICD-10-CM | POA: Diagnosis not present

## 2017-03-13 NOTE — Therapy (Signed)
[place], [place], [address] Phone: [phone]   Fax:  [phone]  Physical Therapy Treatment  Patient Details  Name: Anita Frazier MRN: [id number] Date of Birth: 1965/09/02 Referring Provider: Dr. Iran Planas  Encounter Date: 03/13/2017      PT End of Session - 03/13/17 1101    Visit Number 2   Number of Visits 9   Date for PT Re-Evaluation 04/05/17   PT Start Time 1024   PT Stop Time 1108   PT Time Calculation (min) 44 min   Activity Tolerance Patient tolerated treatment well   Behavior During Therapy [facility] for tasks assessed/performed      Past Medical History:  Diagnosis Date  . Abnormal Pap smear 1992 & 3/99   LEEP  . ASCUS (atypical squamous cells of undetermined significance) on Pap smear 08/1997  . Breast cancer ([place])   . 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 Dysplasia of cervix, low grade (CIN 1)   . Factor 5 Leiden mutation, heterozygous ([place]) 06/09/04  . Family history of breast cancer   . Irregular menses 01/2001  . Menstrual period late 11/17/10  . Pelvic pain 12/05/06    Past Surgical History:  Procedure Laterality Date  . ARTHROSCOPIC REPAIR ACL Left 2008  . BREAST RECONSTRUCTION WITH PLACEMENT OF TISSUE EXPANDER AND FLEX HD (ACELLULAR HYDRATED DERMIS) Bilateral 02/15/2017   Procedure: BILATERAL BREAST RECONSTRUCTION WITH PLACEMENT OF TISSUE EXPANDERS AND ALLODERM;  Surgeon: Irene Limbo, MD;  Location: [facility];  Service: Plastics;  Laterality: Bilateral;  . LEEP  1992  . MASTECTOMY W/ SENTINEL NODE BIOPSY Bilateral 02/15/2017   Procedure: BILATERAL SKIN SPARING MASTECTOMIES WITH LEFT AXILLARY SENTINEL LYMPH NODE BIOPSY;  Surgeon: Jovita Kussmaul, MD;  Location: [facility];  Service: General;  Laterality: Bilateral;    There were no vitals filed for this visit.      Subjective Assessment - 03/13/17 1028    Subjective Been  doing the HEP and it's going pretty well. Only c/o is I've been having some burning/tightness at the Lt of my breast bone with certain motions, like pulling open a door. Overall though it's going well. Still having to sleep in my chair though, laying down is too uncomfortable.   Pertinent History Biopsy of the left breast upper outer quadrant mass 12/11/2016 showed ([id number]) ductal carcinoma in situ, high-grade, with areas highly suspicious for invasion. There are 2 prognstic panels, both estrogen and progesterone receptor negative, with MIB-1 between 10 and 15%. The tumor was HER-2 amplified, with a signals ratio 4.25 and the number per cell 9.35, s/p 02/15/17 bilateral mastectomies with L SNLB,   Patient Stated Goals to return to my normal ROM   Currently in Pain? No/denies                         [facility] Adult PT Treatment/Exercise - 03/13/17 0001      Shoulder Exercises: Supine   External Rotation AAROM;Both;5 reps  with dowel and 5 sec holds   External Rotation Limitations Also did with fingers clasped behind head and abductiong elbows   Flexion AAROM;Both;5 reps  with dowel and 5 sec holds   ABduction AAROM;Both;5 reps  with dowel and 5 sec holds     Shoulder Exercises: Pulleys   Flexion 2 minutes   ABduction 2 minutes     Shoulder Exercises: Therapy Ball   Flexion 5 reps  With  forward lean into end of stretch   ABduction 5 reps  Bil UE with side lean into end of same side stretch     Manual Therapy   Manual Therapy Passive ROM;Myofascial release   Myofascial Release Bil UE pulling   Passive ROM In Supine to bil shoulders into flexion, abduction, er, and D2                 PT Education - 03/13/17 1033    Education provided Yes   Education Details Reviewed supine dowel exercises while assessing technique.   Person(s) Educated Patient   Methods Explanation;Demonstration;Handout   Comprehension Verbalized understanding;Returned demonstration                 [facility] Goals - 03/08/17 1157      CC Long Term Goal  #1   Title Pt to demonstrate 170 degrees of bilateral shoulder flexion to allow her to reach items overhead.   Baseline R 131, L 129   Time 4   Period Weeks   Status New   Target Date 04/05/17     CC Long Term Goal  #2   Title Pt to demonstrate 170 degrees of bilateral shoulder abduction to allow pt to reach out to sides.   Baseline R 105, L 104   Time 4   Period Weeks   Status New   Target Date 04/05/17     CC Long Term Goal  #3   Title Pt to demonstrate 85 degrees of left shoulder external rotation to allow her to return to prior level of function.   Baseline 65   Time 4   Period Weeks   Status New   Target Date 04/05/17     CC Long Term Goal  #4   Title Pt to be able to independently verbalize lymphedema risk reduction practices   Time 4   Period Weeks   Status New   Target Date 04/05/17     CC Long Term Goal  #5   Title Pt to be independent in a home exercise program for continued strengthening and stretching   Time 4   Period Weeks   Status New            Plan - 03/13/17 1106    Clinical Impression Statement Today was pts first sesion of having P/ROM stretching and AA/ROM activities with pulleys and ball on wall. She tolerated session very well reporting feeling good after session. She also did well with review of her HEP, demonstrating good technique.    Rehab Potential Excellent   PT Frequency 2x / week   PT Duration 4 weeks   PT Next Visit Plan Cont with manual stretching and AA/ROM exercises focusing on her end ROM.   Consulted and Agree with Plan of Care Patient      Patient will benefit from skilled therapeutic intervention in order to improve the following deficits and impairments:     Visit Diagnosis: Stiffness of left shoulder, not elsewhere classified  Stiffness of right shoulder, not elsewhere classified  Abnormal posture  Muscle weakness  (generalized)     Problem List Patient Active Problem List   Diagnosis Date Noted  . 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 Genetic testing 01/17/2017  . Family history of breast cancer 01/11/2017  . Carcinoma of upper-outer quadrant of left breast in female, estrogen receptor negative ([place]) 12/27/2016  . Factor  5 Leiden mutation, heterozygous ([place])   . Dysplasia of cervix, low grade (CIN 1)     Otelia Limes, PTA 03/13/2017, 11:09 AM  [place], [place], [address] Phone: [phone]   Fax:  [phone]  Name: CHANIE SOUCEK MRN: [id number] Date of Birth: 10/24/1965

## 2017-03-14 ENCOUNTER — Ambulatory Visit: Payer: 59 | Admitting: Physical Therapy

## 2017-03-14 DIAGNOSIS — M25612 Stiffness of left shoulder, not elsewhere classified: Secondary | ICD-10-CM

## 2017-03-14 DIAGNOSIS — M6281 Muscle weakness (generalized): Secondary | ICD-10-CM

## 2017-03-14 DIAGNOSIS — M25611 Stiffness of right shoulder, not elsewhere classified: Secondary | ICD-10-CM

## 2017-03-14 DIAGNOSIS — R293 Abnormal posture: Secondary | ICD-10-CM

## 2017-03-14 NOTE — Patient Instructions (Addendum)
First of all, check with your insurance company to see if provider is in [place]   (for wigs and compression sleeves / gloves/gauntlets )  [place], [place] [address] [phone]  Will file some insurances --- call for appointment   Second to [facility] (for mastectomy prosthetics and garments) [place], [place] [address] [phone] Will file some insurances --- call for appointment  [facility]  [address]  [place], [place] [address] [phone] Lower extremity garments  [facility] [address] [place], [place]  [address] [place] Sales rep:  [place]:  [phone] [email or web address] Biocompression pumps   [employer]  Sales rep: Donneta Romberg:  [phone] [email or web address] [place] Pulley and Flexitouch pumps    Other Resources: National Lymphedema Network:  www.lymphnet.org www.[email or web address] for patient articles and purchase a self manual lymph drainage DVD [email or web address] has informative articles.     Over Head Pull: Narrow and Wide Grip   Cancer Rehab [phone]   On back, knees bent, feet flat, band across thighs, elbows straight but relaxed. Pull hands apart (start). Keeping elbows straight, bring arms up and over head, hands toward floor. Keep pull steady on band. Hold momentarily. Return slowly, keeping pull steady, back to start. Then do same with a wider grip on the band (past shoulder width) Repeat _5-10__ times. Band color __yellow____   Side Pull: Double Arm   On back, knees bent, feet flat. Arms perpendicular to body, shoulder level, elbows straight but relaxed. Pull arms out to sides, elbows straight. Resistance band comes across collarbones, hands toward floor. Hold momentarily. Slowly return to starting position. Repeat _5-10__ times. Band color _yellow____   Sword   On back, knees bent, feet flat, left  hand on left hip, right hand above left. Pull right arm DIAGONALLY (hip to shoulder) across chest. Bring right arm along head toward floor. Hold momentarily. Slowly return to starting position. Repeat _5-10__ times. Do with left arm. Band color _yellow_____   Shoulder Rotation: Double Arm   On back, knees bent, feet flat, elbows tucked at sides, bent 90, hands palms up. Pull hands apart and down toward floor, keeping elbows near sides. Hold momentarily. Slowly return to starting position. Repeat _5-10__ times. Band color __yellow____

## 2017-03-14 NOTE — Therapy (Signed)
[place], [place], [address] Phone: [phone]   Fax:  [phone]  Physical Therapy Treatment  Patient Details  Name: Anita Frazier MRN: [id number] Date of Birth: 04-Feb-1966 Referring Provider: Dr. Iran Planas  Encounter Date: 03/14/2017      PT End of Session - 03/14/17 1523    Visit Number 3   Number of Visits 9   Date for PT Re-Evaluation 04/05/17   PT Start Time 1430   PT Stop Time 1515   PT Time Calculation (min) 45 min   Activity Tolerance Patient tolerated treatment well   Behavior During Therapy [facility] for tasks assessed/performed      Past Medical History:  Diagnosis Date  . Abnormal Pap smear 1992 & 3/99   LEEP  . ASCUS (atypical squamous cells of undetermined significance) on Pap smear 08/1997  . Breast cancer ([place])   . 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 Dysplasia of cervix, low grade (CIN 1)   . Factor 5 Leiden mutation, heterozygous ([place]) 06/09/04  . Family history of breast cancer   . Irregular menses 01/2001  . Menstrual period late 11/17/10  . Pelvic pain 12/05/06    Past Surgical History:  Procedure Laterality Date  . ARTHROSCOPIC REPAIR ACL Left 2008  . BREAST RECONSTRUCTION WITH PLACEMENT OF TISSUE EXPANDER AND FLEX HD (ACELLULAR HYDRATED DERMIS) Bilateral 02/15/2017   Procedure: BILATERAL BREAST RECONSTRUCTION WITH PLACEMENT OF TISSUE EXPANDERS AND ALLODERM;  Surgeon: Irene Limbo, MD;  Location: [facility];  Service: Plastics;  Laterality: Bilateral;  . LEEP  1992  . MASTECTOMY W/ SENTINEL NODE BIOPSY Bilateral 02/15/2017   Procedure: BILATERAL SKIN SPARING MASTECTOMIES WITH LEFT AXILLARY SENTINEL LYMPH NODE BIOPSY;  Surgeon: Jovita Kussmaul, MD;  Location: [facility];  Service: General;  Laterality: Bilateral;    There were no vitals filed for this visit.      Subjective Assessment - 03/14/17 1438    Subjective Pt  states she is feeling good today.   She is going for her last fill tomorrow.    Pertinent History Biopsy of the left breast upper outer quadrant mass 12/11/2016 showed ([id number]) ductal carcinoma in situ, high-grade, with areas highly suspicious for invasion. There are 2 prognstic panels, both estrogen and progesterone receptor negative, with MIB-1 between 10 and 15%. The tumor was HER-2 amplified, with a signals ratio 4.25 and the number per cell 9.35, s/p 02/15/17 bilateral mastectomies with L SNLB,   Patient Stated Goals to return to my normal ROM   Currently in Pain? No/denies                         [facility] Adult PT Treatment/Exercise - 03/14/17 0001      Self-Care   Self-Care Other Self-Care Comments   Other Self-Care Comments  brief review of lymphedema risk reduction and use of prophylactic compressio sleeve.  Pt states she is signed up for ABC class.      Shoulder Exercises: Supine   Horizontal ABduction AROM;Right;Left;Both;5 reps   Horizontal ABduction Limitations added folded towel roll for pt to lie on    External Rotation Strengthening;Both;5 reps;Theraband   Theraband Level (Shoulder External Rotation) Level 1 (Yellow)   Flexion Strengthening;Right;Left;5 reps;Theraband   Theraband Level (Shoulder Flexion) Level 1 (Yellow)   Other Supine Exercises diagonal elevation with yellow theraband, with only partial range on left UE due to difficulty controlling shoulder  Shoulder Exercises: Sidelying   Other Sidelying Exercises horizontal abduction with backward thoracic rotation to each side.  Pt had difficulty stabalizing left shoulder with this                 PT Education - 03/14/17 1517    Education provided Yes   Education Details brief lymphedema risk reduction and where to get a compression sleeve, supine scap series    Person(s) Educated Patient   Methods Explanation;Demonstration;Handout   Comprehension Verbalized understanding;Returned  demonstration                [facility] Goals - 03/08/17 1157      CC Long Term Goal  #1   Title Pt to demonstrate 170 degrees of bilateral shoulder flexion to allow her to reach items overhead.   Baseline R 131, L 129   Time 4   Period Weeks   Status New   Target Date 04/05/17     CC Long Term Goal  #2   Title Pt to demonstrate 170 degrees of bilateral shoulder abduction to allow pt to reach out to sides.   Baseline R 105, L 104   Time 4   Period Weeks   Status New   Target Date 04/05/17     CC Long Term Goal  #3   Title Pt to demonstrate 85 degrees of left shoulder external rotation to allow her to return to prior level of function.   Baseline 65   Time 4   Period Weeks   Status New   Target Date 04/05/17     CC Long Term Goal  #4   Title Pt to be able to independently verbalize lymphedema risk reduction practices   Time 4   Period Weeks   Status New   Target Date 04/05/17     CC Long Term Goal  #5   Title Pt to be independent in a home exercise program for continued strengthening and stretching   Time 4   Period Weeks   Status New            Plan - 03/14/17 1524    Clinical Impression Statement Pt is doing well today, but showed some decrease left scapular control with exercise today.  She reports a little more discomfort with left side overall.  She understood beginning lymphedema risk reduction instruction and will take a prescription to Dr. Iran Planas for sleeve to get signed tomorrow .    PT Frequency 2x / week   PT Duration 4 weeks   PT Next Visit Plan Extra attenttion to left shoulder but work on both for manual stretching and AA/ROM exercises focusing on her end ROM. Assess how she did with supine scapular series. Remeasuare shoulder ROM for goals  Add to HEP as indicated.  When ready, teach Strength ABC program    Consulted and Agree with Plan of Care Patient      Patient will benefit from skilled therapeutic intervention in order to  improve the following deficits and impairments:  Decreased knowledge of precautions, Decreased range of motion, Decreased strength, Impaired UE functional use, Postural dysfunction, Decreased scar mobility, Increased fascial restricitons  Visit Diagnosis: Stiffness of left shoulder, not elsewhere classified  Stiffness of right shoulder, not elsewhere classified  Abnormal posture  Muscle weakness (generalized)     Problem List Patient Active Problem List   Diagnosis Date Noted  . 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 Monoallelic mutation of  MUTYH gene 01/31/2017  . Genetic testing 01/17/2017  . Family history of breast cancer 01/11/2017  . Carcinoma of upper-outer quadrant of left breast in female, estrogen receptor negative ([place]) 12/27/2016  . Factor 5 Leiden mutation, heterozygous ([place])   . Dysplasia of cervix, low grade (CIN 1)    Donato Heinz. Owens Shark PT  [place] 03/14/2017, 3:28 PM  [place], [place], [address] Phone: [phone]   Fax:  [phone]  Name: Anita Frazier MRN: [id number] Date of Birth: November 01, 1965

## 2017-03-21 ENCOUNTER — Ambulatory Visit: Payer: 59

## 2017-03-21 DIAGNOSIS — M6281 Muscle weakness (generalized): Secondary | ICD-10-CM

## 2017-03-21 DIAGNOSIS — M25612 Stiffness of left shoulder, not elsewhere classified: Secondary | ICD-10-CM

## 2017-03-21 DIAGNOSIS — M25611 Stiffness of right shoulder, not elsewhere classified: Secondary | ICD-10-CM

## 2017-03-21 DIAGNOSIS — R293 Abnormal posture: Secondary | ICD-10-CM

## 2017-03-21 NOTE — Therapy (Signed)
[place], [place], [address] Phone: [phone]   Fax:  [phone]  Physical Therapy Treatment  Patient Details  Name: Anita Frazier MRN: [id number] Date of Birth: October 02, 1965 Referring Provider: Dr. Iran Planas  Encounter Date: 03/21/2017      PT End of Session - 03/21/17 1018    Visit Number 4   Number of Visits 9   Date for PT Re-Evaluation 04/05/17   PT Start Time 0935   PT Stop Time 1019   PT Time Calculation (min) 44 min   Activity Tolerance Patient tolerated treatment well   Behavior During Therapy [facility] for tasks assessed/performed      Past Medical History:  Diagnosis Date  . Abnormal Pap smear 1992 & 3/99   LEEP  . ASCUS (atypical squamous cells of undetermined significance) on Pap smear 08/1997  . Breast cancer ([place])   . 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 Dysplasia of cervix, low grade (CIN 1)   . Factor 5 Leiden mutation, heterozygous ([place]) 06/09/04  . Family history of breast cancer   . Irregular menses 01/2001  . Menstrual period late 11/17/10  . Pelvic pain 12/05/06    Past Surgical History:  Procedure Laterality Date  . ARTHROSCOPIC REPAIR ACL Left 2008  . BREAST RECONSTRUCTION WITH PLACEMENT OF TISSUE EXPANDER AND FLEX HD (ACELLULAR HYDRATED DERMIS) Bilateral 02/15/2017   Procedure: BILATERAL BREAST RECONSTRUCTION WITH PLACEMENT OF TISSUE EXPANDERS AND ALLODERM;  Surgeon: Irene Limbo, MD;  Location: [facility];  Service: Plastics;  Laterality: Bilateral;  . LEEP  1992  . MASTECTOMY W/ SENTINEL NODE BIOPSY Bilateral 02/15/2017   Procedure: BILATERAL SKIN SPARING MASTECTOMIES WITH LEFT AXILLARY SENTINEL LYMPH NODE BIOPSY;  Surgeon: Jovita Kussmaul, MD;  Location: [facility];  Service: General;  Laterality: Bilateral;    There were no vitals filed for this visit.      Subjective Assessment - 03/21/17 0939    Subjective I had  my last fill Friday and was really sore that day and the inferior near my sternum pain was bad that day but then I made my self stretch all weekend and I have not felt that pain since! I am very happy about that. Dr. Iran Planas and I discussed the possibility of me having my reconstruction by end of the year.    Pertinent History Biopsy of the left breast upper outer quadrant mass 12/11/2016 showed ([id number]) ductal carcinoma in situ, high-grade, with areas highly suspicious for invasion. There are 2 prognstic panels, both estrogen and progesterone receptor negative, with MIB-1 between 10 and 15%. The tumor was HER-2 amplified, with a signals ratio 4.25 and the number per cell 9.35, s/p 02/15/17 bilateral mastectomies with L SNLB,   Patient Stated Goals to return to my normal ROM   Currently in Pain? No/denies            [facility] PT Assessment - 03/21/17 0001      AROM   Right Shoulder Flexion 154 Degrees   Right Shoulder ABduction 166 Degrees   Right Shoulder Internal Rotation 71 Degrees   Right Shoulder External Rotation 90 Degrees   Left Shoulder Flexion 152 Degrees   Left Shoulder ABduction 169 Degrees   Left Shoulder Internal Rotation 90 Degrees   Left Shoulder External Rotation 90 Degrees                     [facility] Adult PT Treatment/Exercise - 03/21/17  0001      Shoulder Exercises: Pulleys   Flexion 2 minutes   Flexion Limitations VCs to remind pt to decrease shoulder hike   ABduction 2 minutes   ABduction Limitations VCs to relax shoulders     Shoulder Exercises: Therapy Ball   Flexion 10 reps  With forward lean into end of stretch   ABduction 5 reps  Bil UE with side lean into end of same side stretch     Shoulder Exercises: ROM/Strengthening   "W" Arms Back, shoulders, and head against wall 5 times for 5 seconds     Shoulder Exercises: Stretch   Corner Stretch 3 reps;20 seconds  In doorway     Manual Therapy   Manual Therapy Passive ROM;Myofascial  release   Myofascial Release Bil UE pulling   Passive ROM In Supine to bil shoulders into flexion, abduction, IR, and D2                 PT Education - 03/21/17 1020    Education provided Yes   Education Details Briefly reviewed supine scapular series which pt was tolerating performing very well with good technique.    Person(s) Educated Patient   Methods Explanation;Demonstration   Comprehension Returned demonstration;Verbalized understanding                [facility] Goals - 03/08/17 1157      CC Long Term Goal  #1   Title Pt to demonstrate 170 degrees of bilateral shoulder flexion to allow her to reach items overhead.   Baseline R 131, L 129   Time 4   Period Weeks   Status New   Target Date 04/05/17     CC Long Term Goal  #2   Title Pt to demonstrate 170 degrees of bilateral shoulder abduction to allow pt to reach out to sides.   Baseline R 105, L 104   Time 4   Period Weeks   Status New   Target Date 04/05/17     CC Long Term Goal  #3   Title Pt to demonstrate 85 degrees of left shoulder external rotation to allow her to return to prior level of function.   Baseline 65   Time 4   Period Weeks   Status New   Target Date 04/05/17     CC Long Term Goal  #4   Title Pt to be able to independently verbalize lymphedema risk reduction practices   Time 4   Period Weeks   Status New   Target Date 04/05/17     CC Long Term Goal  #5   Title Pt to be independent in a home exercise program for continued strengthening and stretching   Time 4   Period Weeks   Status New            Plan - 03/21/17 1019    Clinical Impression Statement Pt has made excellent gains with her ROM since last time measured and reports since her final Friday and stretching all weekend she has been feeling much better. Sharp pains she was feeling at inferior breast near sternum have resolved over past few days as well. She is still tight at her end ROM and will cont to  benefit from cont therapy to help her regain full ROM.    Rehab Potential Excellent   PT Frequency 2x / week   PT Duration 4 weeks   PT Treatment/Interventions ADLs/Self Care Home Management;Therapeutic exercise;Patient/family education;Passive range of motion;Scar  mobilization;Manual techniques   PT Next Visit Plan Extra attenttion to left shoulder but work on both for manual stretching and AA/ROM exercises focusing on her end ROM. Assess how she did with supine scapular series. Remeasuare shoulder ROM for goals  Add to HEP as indicated.  When ready, teach Strength ABC program    Consulted and Agree with Plan of Care Patient      Patient will benefit from skilled therapeutic intervention in order to improve the following deficits and impairments:  Decreased knowledge of precautions, Decreased range of motion, Decreased strength, Impaired UE functional use, Postural dysfunction, Decreased scar mobility, Increased fascial restricitons  Visit Diagnosis: Stiffness of left shoulder, not elsewhere classified  Stiffness of right shoulder, not elsewhere classified  Abnormal posture  Muscle weakness (generalized)     Problem List Patient Active Problem List   Diagnosis Date Noted  . 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 Genetic testing 01/17/2017  . Family history of breast cancer 01/11/2017  . Carcinoma of upper-outer quadrant of left breast in female, estrogen receptor negative ([place]) 12/27/2016  . Factor 5 Leiden mutation, heterozygous ([place])   . Dysplasia of cervix, low grade (CIN 1)     Otelia Limes, PTA 03/21/2017, 10:21 AM  [place], [place], [address] Phone: [phone]   Fax:  [phone]  Name: Anita Frazier MRN: [id number] Date of Birth: [birth year]

## 2017-03-21 NOTE — Patient Instructions (Signed)
CHEST: Doorway, Bilateral - Standing    Standing in doorway with one foot in front of other, place hands on wall with elbows bent at shoulder height. Lean forward. Hold _10-20__ seconds. _3-5__ reps per set, _3-4__ sets per day.  Copyright  VHI. All rights reserved.   Cancer Rehab [phone]

## 2017-03-23 ENCOUNTER — Ambulatory Visit: Payer: 59 | Admitting: Physical Therapy

## 2017-03-23 DIAGNOSIS — M25611 Stiffness of right shoulder, not elsewhere classified: Secondary | ICD-10-CM

## 2017-03-23 DIAGNOSIS — M25612 Stiffness of left shoulder, not elsewhere classified: Secondary | ICD-10-CM

## 2017-03-23 DIAGNOSIS — R293 Abnormal posture: Secondary | ICD-10-CM

## 2017-03-23 DIAGNOSIS — M6281 Muscle weakness (generalized): Secondary | ICD-10-CM

## 2017-03-23 NOTE — Therapy (Signed)
[place], [place], [address] Phone: [phone]   Fax:  [phone]  Physical Therapy Treatment  Patient Details  Name: Anita Frazier MRN: [id number] Date of Birth: 06-26-1966 Referring Provider: Dr. Iran Planas  Encounter Date: 03/23/2017      PT End of Session - 03/23/17 0824    Visit Number 5   Number of Visits 9   Date for PT Re-Evaluation 04/05/17   PT Start Time 0800   PT Stop Time 0844   PT Time Calculation (min) 44 min   Activity Tolerance Patient tolerated treatment well   Behavior During Therapy [facility] for tasks assessed/performed      Past Medical History:  Diagnosis Date  . Abnormal Pap smear 1992 & 3/99   LEEP  . ASCUS (atypical squamous cells of undetermined significance) on Pap smear 08/1997  . Breast cancer ([place])   . 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 Dysplasia of cervix, low grade (CIN 1)   . Factor 5 Leiden mutation, heterozygous ([place]) 06/09/04  . Family history of breast cancer   . Irregular menses 01/2001  . Menstrual period late 11/17/10  . Pelvic pain 12/05/06    Past Surgical History:  Procedure Laterality Date  . ARTHROSCOPIC REPAIR ACL Left 2008  . BREAST RECONSTRUCTION WITH PLACEMENT OF TISSUE EXPANDER AND FLEX HD (ACELLULAR HYDRATED DERMIS) Bilateral 02/15/2017   Procedure: BILATERAL BREAST RECONSTRUCTION WITH PLACEMENT OF TISSUE EXPANDERS AND ALLODERM;  Surgeon: Irene Limbo, MD;  Location: [place];  Service: Plastics;  Laterality: Bilateral;  . LEEP  1992  . MASTECTOMY W/ SENTINEL NODE BIOPSY Bilateral 02/15/2017   Procedure: BILATERAL SKIN SPARING MASTECTOMIES WITH LEFT AXILLARY SENTINEL LYMPH NODE BIOPSY;  Surgeon: Jovita Kussmaul, MD;  Location: [facility];  Service: General;  Laterality: Bilateral;    There were no vitals filed for this visit.      Subjective Assessment - 03/23/17 0801    Subjective I felt  good after last time. The sternum pain and pain at the drain site has gone away.    Pertinent History Biopsy of the left breast upper outer quadrant mass 12/11/2016 showed ([id number]) ductal carcinoma in situ, high-grade, with areas highly suspicious for invasion. There are 2 prognstic panels, both estrogen and progesterone receptor negative, with MIB-1 between 10 and 15%. The tumor was HER-2 amplified, with a signals ratio 4.25 and the number per cell 9.35, s/p 02/15/17 bilateral mastectomies with L SNLB,   Patient Stated Goals to return to my normal ROM   Currently in Pain? No/denies   Pain Score 0-No pain            [facility] PT Assessment - 03/23/17 0001      AROM   Right Shoulder Flexion 168 Degrees   Right Shoulder ABduction 151 Degrees   Left Shoulder Flexion 169 Degrees   Left Shoulder ABduction 137 Degrees   Left Shoulder External Rotation 90 Degrees                     [facility] Adult PT Treatment/Exercise - 03/23/17 0001      Shoulder Exercises: Supine   Horizontal ABduction Strengthening;Both;10 reps;Theraband   Theraband Level (Shoulder Horizontal ABduction) Level 2 (Red)   External Rotation Strengthening;Both;10 reps;Theraband   Theraband Level (Shoulder External Rotation) Level 2 (Red)   Flexion Strengthening;Right;Left;Theraband;10 reps  narrow and wide    Theraband Level (Shoulder Flexion) Level 2 (Red)   Other  Supine Exercises diagonal elevation with yellow theraband, with only partial range on left UE due to difficulty controlling shoulder    Other Supine Exercises D2 x 10 reps bilaterally with red band     Shoulder Exercises: Pulleys   Flexion 2 minutes   Flexion Limitations VCs to remind pt to decrease shoulder hike   ABduction 2 minutes   ABduction Limitations VCs to relax shoulders     Shoulder Exercises: Therapy Ball   Flexion 10 reps  With forward lean into end of stretch   ABduction 5 reps  Bil UE with side lean into end of same side stretch      Shoulder Exercises: ROM/Strengthening   "W" Arms Back, shoulders, and head against wall 5 times for 5 seconds     Manual Therapy   Manual Therapy Passive ROM;Myofascial release   Myofascial Release Bil UE pulling   Passive ROM In Supine to bil shoulders into abduction and IR                        Long Term Clinic Goals - 03/23/17 8527      CC Long Term Goal  #1   Title Pt to demonstrate 170 degrees of bilateral shoulder flexion to allow her to reach items overhead.   Baseline R 131, L 129, 03/23/17- R 168, L 169   Time 4   Period Weeks   Status On-going     CC Long Term Goal  #2   Title Pt to demonstrate 170 degrees of bilateral shoulder abduction to allow pt to reach out to sides.   Baseline R 105, L 104, 03/23/17- R 151, L 137   Time 4   Period Weeks   Status On-going     CC Long Term Goal  #3   Title Pt to demonstrate 85 degrees of left shoulder external rotation to allow her to return to prior level of function.   Baseline 65, 03/23/17- 90   Time 4   Period Weeks   Status Achieved     CC Long Term Goal  #4   Title Pt to be able to independently verbalize lymphedema risk reduction practices   Baseline 03/23/17- pt able to independently verbalize   Time 4   Period Weeks   Status Achieved     CC Long Term Goal  #5   Title Pt to be independent in a home exercise program for continued strengthening and stretching   Time 4   Period Weeks   Status On-going            Plan - 03/23/17 7824    Clinical Impression Statement Upgraded pt to red theraband today for supine scapular exercises. Also assessed pt's progress towards all goals. She has met her ER goal and lymphedema risk reduction goal. She has made excellent progress towards all other goals in therapy. Continued with manual  therapy to improve bilateral shoulder abduction.    Rehab Potential Excellent   PT Frequency 2x / week   PT Duration 4 weeks   PT Treatment/Interventions ADLs/Self Care  Home Management;Therapeutic exercise;Patient/family education;Passive range of motion;Scar mobilization;Manual techniques   PT Next Visit Plan Extra attenttion to left shoulder but work on both for manual stretching and AA/ROM exercises focusing on her end ROM. Add to HEP as indicated.  When ready, teach Strength ABC program    PT Home Exercise Plan post op breast cancer, supine dowel, supine scapular exercises   Consulted and  Agree with Plan of Care Patient      Patient will benefit from skilled therapeutic intervention in order to improve the following deficits and impairments:  Decreased knowledge of precautions, Decreased range of motion, Decreased strength, Impaired UE functional use, Postural dysfunction, Decreased scar mobility, Increased fascial restricitons  Visit Diagnosis: Stiffness of left shoulder, not elsewhere classified  Stiffness of right shoulder, not elsewhere classified  Abnormal posture  Muscle weakness (generalized)     Problem List Patient Active Problem List   Diagnosis Date Noted  . 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 Genetic testing 01/17/2017  . Family history of breast cancer 01/11/2017  . Carcinoma of upper-outer quadrant of left breast in female, estrogen receptor negative ([place]) 12/27/2016  . Factor 5 Leiden mutation, heterozygous ([place])   . Dysplasia of cervix, low grade (CIN 1)     Allyson Sabal [facility] 03/23/2017, 8:46 AM  [place], [place], [address] Phone: [phone]   Fax:  [phone]  Name: Anita Frazier MRN: [id number] Date of Birth: 1965-12-27  Manus Gunning, PT 03/23/17 8:46 AM

## 2017-03-26 ENCOUNTER — Encounter: Payer: Self-pay | Admitting: Physical Therapy

## 2017-03-26 ENCOUNTER — Ambulatory Visit: Payer: 59 | Admitting: Physical Therapy

## 2017-03-26 DIAGNOSIS — M25612 Stiffness of left shoulder, not elsewhere classified: Secondary | ICD-10-CM | POA: Diagnosis not present

## 2017-03-26 DIAGNOSIS — M25611 Stiffness of right shoulder, not elsewhere classified: Secondary | ICD-10-CM

## 2017-03-26 DIAGNOSIS — R293 Abnormal posture: Secondary | ICD-10-CM

## 2017-03-26 NOTE — Therapy (Signed)
[place], [place], [address] Phone: [phone]   Fax:  [phone]  Physical Therapy Treatment  Patient Details  Name: Anita Frazier MRN: [id number] Date of Birth: [birth year] Referring Provider: Dr. Iran Planas  Encounter Date: 03/26/2017      PT End of Session - 03/26/17 0932    Visit Number 6   Number of Visits 9   Date for PT Re-Evaluation 04/05/17   PT Start Time 0849   PT Stop Time 0932   PT Time Calculation (min) 43 min   Activity Tolerance Patient tolerated treatment well   Behavior During Therapy [facility] for tasks assessed/performed      Past Medical History:  Diagnosis Date  . Abnormal Pap smear 1992 & 3/99   LEEP  . ASCUS (atypical squamous cells of undetermined significance) on Pap smear 08/1997  . Breast cancer ([place])   . 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 Dysplasia of cervix, low grade (CIN 1)   . Factor 5 Leiden mutation, heterozygous ([place]) 06/09/04  . Family history of breast cancer   . Irregular menses 01/2001  . Menstrual period late 11/17/10  . Pelvic pain 12/05/06    Past Surgical History:  Procedure Laterality Date  . ARTHROSCOPIC REPAIR ACL Left 2008  . BREAST RECONSTRUCTION WITH PLACEMENT OF TISSUE EXPANDER AND FLEX HD (ACELLULAR HYDRATED DERMIS) Bilateral 02/15/2017   Procedure: BILATERAL BREAST RECONSTRUCTION WITH PLACEMENT OF TISSUE EXPANDERS AND ALLODERM;  Surgeon: Irene Limbo, MD;  Location: [facility];  Service: Plastics;  Laterality: Bilateral;  . LEEP  1992  . MASTECTOMY W/ SENTINEL NODE BIOPSY Bilateral 02/15/2017   Procedure: BILATERAL SKIN SPARING MASTECTOMIES WITH LEFT AXILLARY SENTINEL LYMPH NODE BIOPSY;  Surgeon: Jovita Kussmaul, MD;  Location: [facility];  Service: General;  Laterality: Bilateral;    There were no vitals filed for this visit.      Subjective Assessment - 03/26/17 0851    Subjective I feel  pretty good. The new exercises were good. I was a little bit sore in my armpit.    Pertinent History Biopsy of the left breast upper outer quadrant mass 12/11/2016 showed ([id number]) ductal carcinoma in situ, high-grade, with areas highly suspicious for invasion. There are 2 prognstic panels, both estrogen and progesterone receptor negative, with MIB-1 between 10 and 15%. The tumor was HER-2 amplified, with a signals ratio 4.25 and the number per cell 9.35, s/p 02/15/17 bilateral mastectomies with L SNLB,   Patient Stated Goals to return to my normal ROM   Currently in Pain? No/denies   Pain Score 0-No pain                         [facility] Adult PT Treatment/Exercise - 03/26/17 0001      Shoulder Exercises: Pulleys   Flexion 2 minutes   Flexion Limitations VCs to remind pt to decrease shoulder hike   ABduction 2 minutes   ABduction Limitations VCs to relax shoulders     Shoulder Exercises: Therapy Ball   Flexion 10 reps  With forward lean into end of stretch   ABduction 10 reps  Bil UE with side lean into end of same side stretch     Shoulder Exercises: ROM/Strengthening   "W" Arms Back, shoulders, and head against wall 5 times for 5 seconds     Shoulder Exercises: Stretch   Corner Stretch 3 reps;20 seconds  In doorway  Manual Therapy   Manual Therapy Passive ROM;Myofascial release   Myofascial Release Bil UE pulling   Passive ROM In Supine to bil shoulders into abduction and IR  removed pillow for more of a stretch                        Long Term Clinic Goals - 03/23/17 3007      CC Long Term Goal  #1   Title Pt to demonstrate 170 degrees of bilateral shoulder flexion to allow her to reach items overhead.   Baseline R 131, L 129, 03/23/17- R 168, L 169   Time 4   Period Weeks   Status On-going     CC Long Term Goal  #2   Title Pt to demonstrate 170 degrees of bilateral shoulder abduction to allow pt to reach out to sides.   Baseline R  105, L 104, 03/23/17- R 151, L 137   Time 4   Period Weeks   Status On-going     CC Long Term Goal  #3   Title Pt to demonstrate 85 degrees of left shoulder external rotation to allow her to return to prior level of function.   Baseline 65, 03/23/17- 90   Time 4   Period Weeks   Status Achieved     CC Long Term Goal  #4   Title Pt to be able to independently verbalize lymphedema risk reduction practices   Baseline 03/23/17- pt able to independently verbalize   Time 4   Period Weeks   Status Achieved     CC Long Term Goal  #5   Title Pt to be independent in a home exercise program for continued strengthening and stretching   Time 4   Period Weeks   Status On-going            Plan - 03/26/17 1036    Clinical Impression Statement Continued with AAROM exercises today with pt feeling an increased stretch. Pt states she has been stretching her RUE more at home - this UE was more tight last session. Today she demonstrates improved range of motion. Removed pillow from behind pt's head to help improve stretch since that was limiting end range of motion. Continued with manual therapy and prolonged stretches.    Rehab Potential Excellent   PT Frequency 2x / week   PT Duration 4 weeks   PT Treatment/Interventions ADLs/Self Care Home Management;Therapeutic exercise;Patient/family education;Passive range of motion;Scar mobilization;Manual techniques   PT Next Visit Plan bilateral shoulder manual stretching and AA/ROM exercises focusing on her end ROM. Add to HEP as indicated.  When ready, teach Strength ABC program    PT Home Exercise Plan post op breast cancer, supine dowel, supine scapular exercises   Consulted and Agree with Plan of Care Patient      Patient will benefit from skilled therapeutic intervention in order to improve the following deficits and impairments:  Decreased knowledge of precautions, Decreased range of motion, Decreased strength, Impaired UE functional use, Postural  dysfunction, Decreased scar mobility, Increased fascial restricitons  Visit Diagnosis: Stiffness of left shoulder, not elsewhere classified  Stiffness of right shoulder, not elsewhere classified  Abnormal posture     Problem List Patient Active Problem List   Diagnosis Date Noted  . 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 Genetic testing 01/17/2017  . Family history of breast cancer 01/11/2017  . Carcinoma  of upper-outer quadrant of left breast in female, estrogen receptor negative ([place]) 12/27/2016  . Factor 5 Leiden mutation, heterozygous ([place])   . Dysplasia of cervix, low grade (CIN 1)     Allyson Sabal [facility] 03/26/2017, 10:40 AM  [place], [place], [address] Phone: [phone]   Fax:  [phone]  Name: Anita Frazier MRN: [id number] Date of Birth: [birth year]  Manus Gunning, PT 03/26/17 10:40 AM

## 2017-03-28 ENCOUNTER — Encounter: Payer: Self-pay | Admitting: Physical Therapy

## 2017-03-28 ENCOUNTER — Ambulatory Visit: Payer: 59 | Admitting: Physical Therapy

## 2017-03-28 DIAGNOSIS — R293 Abnormal posture: Secondary | ICD-10-CM

## 2017-03-28 DIAGNOSIS — M6281 Muscle weakness (generalized): Secondary | ICD-10-CM

## 2017-03-28 DIAGNOSIS — M25612 Stiffness of left shoulder, not elsewhere classified: Secondary | ICD-10-CM | POA: Diagnosis not present

## 2017-03-28 DIAGNOSIS — M25611 Stiffness of right shoulder, not elsewhere classified: Secondary | ICD-10-CM

## 2017-03-28 NOTE — Therapy (Signed)
[place], [place], [address] Phone: [phone]   Fax:  [phone]  Physical Therapy Treatment  Patient Details  Name: Anita Frazier MRN: [id number] Date of Birth: [birth year] Referring Provider: Dr. Iran Planas  Encounter Date: 03/28/2017      PT End of Session - 03/28/17 1711    Visit Number 7   Number of Visits 9   Date for PT Re-Evaluation 04/05/17   PT Start Time 1518   PT Stop Time 1601   PT Time Calculation (min) 43 min   Activity Tolerance Patient tolerated treatment well   Behavior During Therapy [facility] for tasks assessed/performed      Past Medical History:  Diagnosis Date  . Abnormal Pap smear 1992 & 3/99   LEEP  . ASCUS (atypical squamous cells of undetermined significance) on Pap smear 08/1997  . Breast cancer ([place])   . 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 Dysplasia of cervix, low grade (CIN 1)   . Factor 5 Leiden mutation, heterozygous ([place]) 06/09/04  . Family history of breast cancer   . Irregular menses 01/2001  . Menstrual period late 11/17/10  . Pelvic pain 12/05/06    Past Surgical History:  Procedure Laterality Date  . ARTHROSCOPIC REPAIR ACL Left 2008  . BREAST RECONSTRUCTION WITH PLACEMENT OF TISSUE EXPANDER AND FLEX HD (ACELLULAR HYDRATED DERMIS) Bilateral 02/15/2017   Procedure: BILATERAL BREAST RECONSTRUCTION WITH PLACEMENT OF TISSUE EXPANDERS AND ALLODERM;  Surgeon: Irene Limbo, MD;  Location: [place];  Service: Plastics;  Laterality: Bilateral;  . LEEP  1992  . MASTECTOMY W/ SENTINEL NODE BIOPSY Bilateral 02/15/2017   Procedure: BILATERAL SKIN SPARING MASTECTOMIES WITH LEFT AXILLARY SENTINEL LYMPH NODE BIOPSY;  Surgeon: Jovita Kussmaul, MD;  Location: [facility];  Service: General;  Laterality: Bilateral;    There were no vitals filed for this visit.      Subjective Assessment - 03/28/17 1519    Subjective My  shoulders are doing ok. I have been doing the stretches without a pillow behind my head and I can tell a difference. I actually was able to sleep in my bed all nigh last night.    Pertinent History Biopsy of the left breast upper outer quadrant mass 12/11/2016 showed ([id number]) ductal carcinoma in situ, high-grade, with areas highly suspicious for invasion. There are 2 prognstic panels, both estrogen and progesterone receptor negative, with MIB-1 between 10 and 15%. The tumor was HER-2 amplified, with a signals ratio 4.25 and the number per cell 9.35, s/p 02/15/17 bilateral mastectomies with L SNLB,   Patient Stated Goals to return to my normal ROM   Currently in Pain? No/denies   Pain Score 0-No pain            [facility] PT Assessment - 03/28/17 0001      AROM   Right Shoulder Flexion 167 Degrees   Right Shoulder ABduction 171 Degrees   Left Shoulder Flexion 169 Degrees   Left Shoulder ABduction 179 Degrees                     [facility] Adult PT Treatment/Exercise - 03/28/17 0001      Shoulder Exercises: Standing   Other Standing Exercises Instructed pt in entire strength ABC packet using 2 lb weights, holding stretches 30 sec and all exercises x 10   Other Standing Exercises Educated pt about community exercise classes such as [employer] and exercise classes at cancer  center- pt plans on attending [facility] Goals - 03/28/17 1710      CC Long Term Goal  #1   Title Pt to demonstrate 170 degrees of bilateral shoulder flexion to allow her to reach items overhead.   Baseline R 131, L 129, 03/23/17- R 168, L 169, 03/28/17- 167 R and 169 L   Time 4   Period Weeks   Status On-going     CC Long Term Goal  #2   Title Pt to demonstrate 170 degrees of bilateral shoulder abduction to allow pt to reach out to sides.   Baseline R 105, L 104, 03/23/17- R 151, L 137, 03/28/17- 171 R, 179 L   Time 4   Period Weeks   Status Achieved      CC Long Term Goal  #3   Title Pt to demonstrate 85 degrees of left shoulder external rotation to allow her to return to prior level of function.   Baseline 65, 03/23/17- 90   Time 4   Period Weeks   Status Achieved     CC Long Term Goal  #4   Title Pt to be able to independently verbalize lymphedema risk reduction practices   Baseline 03/23/17- pt able to independently verbalize   Time 4   Period Weeks   Status Achieved     CC Long Term Goal  #5   Title Pt to be independent in a home exercise program for continued strengthening and stretching   Time 4   Period Weeks   Status On-going            Plan - 03/28/17 1711    Clinical Impression Statement Educated pt about [employer] program and different exercise classes available at the [facility]. Pt plans on attending these. She has met almost all of her goals for therapy. Instructed pt in Strength ABC program today using 2 lb weights. She performed all exercises x 10 and held all stretches for 30 seconds. Pt will be ready for discharge soon.    Rehab Potential Excellent   PT Frequency 2x / week   PT Duration 4 weeks   PT Treatment/Interventions ADLs/Self Care Home Management;Therapeutic exercise;Patient/family education;Passive range of motion;Scar mobilization;Manual techniques   PT Next Visit Plan assess indep with Strength ABC, add 3 way shoulder exercises and standing [place] next session   PT Home Exercise Plan post op breast cancer, supine dowel, supine scapular exercises, strength ABC      Patient will benefit from skilled therapeutic intervention in order to improve the following deficits and impairments:  Decreased knowledge of precautions, Decreased range of motion, Decreased strength, Impaired UE functional use, Postural dysfunction, Decreased scar mobility, Increased fascial restricitons  Visit Diagnosis: Stiffness of left shoulder, not elsewhere classified  Stiffness of right shoulder, not elsewhere  classified  Abnormal posture  Muscle weakness (generalized)     Problem List Patient Active Problem List   Diagnosis Date Noted  . 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 Genetic testing 01/17/2017  . Family history of breast cancer 01/11/2017  . Carcinoma of upper-outer quadrant of left breast in female, estrogen receptor negative ([place]) 12/27/2016  . Factor 5 Leiden mutation, heterozygous ([place])   . Dysplasia of cervix, low grade (CIN 1)     Allyson Sabal  [facility] 03/28/2017, 5:14 PM  [place], [place], [address] Phone: [phone]   Fax:  [phone]  Name: Anita Frazier MRN: [id number] Date of Birth: 1966/02/24  Manus Gunning, PT 03/28/17 5:14 PM

## 2017-04-03 ENCOUNTER — Ambulatory Visit: Payer: 59 | Admitting: Physical Therapy

## 2017-04-03 DIAGNOSIS — R293 Abnormal posture: Secondary | ICD-10-CM

## 2017-04-03 DIAGNOSIS — M6281 Muscle weakness (generalized): Secondary | ICD-10-CM

## 2017-04-03 DIAGNOSIS — M25612 Stiffness of left shoulder, not elsewhere classified: Secondary | ICD-10-CM

## 2017-04-03 DIAGNOSIS — M25611 Stiffness of right shoulder, not elsewhere classified: Secondary | ICD-10-CM

## 2017-04-03 NOTE — Patient Instructions (Addendum)
3-way arm raises:  In standing, hold 3 lbs. In each hand.  1) Raise both arms out to the sides to shoulder height. 2) Raise both arms up, halfway forward ("V" shape) to shoulder height. 3) Raise both arms up straight forward to shoulder height.  Do 10 reps each, 2-3 sets.   Strengthening: Resisted Flexion    Cancer Rehab [phone]    Hold tubing with left arm at side. Pull forward and up. Move shoulder through pain-free range of motion. Repeat _5-10___ times per set. Do _1-2___ sessions per day.  Strengthening: Resisted Internal Rotation    Hold tubing in left hand, elbow at side and forearm out. Rotate forearm in across body. Repeat _5-10___ times per set. Do _1-2___ sessions per day.  Strengthening: Resisted Extension    Hold tubing in left hand, arm forward. Pull arm back, elbow straight. Repeat __5-10__ times per set. Do __1-2__ sessions per day.   Strengthening: Resisted External Rotation    Hold tubing in left hand, elbow at side and forearm across body. Rotate forearm out. Repeat _5-10___ times per set. Do __1-2__ sessions per day.    Strengthening: Resisted Flexion   Cancer Rehab [phone]    Hold tubing with right arm at side. Pull forward and up. Move shoulder through pain-free range of motion. Repeat __5-10__ times per set. Do _1-2___ sessions per day.  Strengthening: Resisted Internal Rotation    Hold tubing in right hand, elbow at side and forearm out. Rotate forearm in across body. Repeat __5-10__ times per set. Do _1-2___ sessions per day.  Strengthening: Resisted Extension    Hold tubing in right hand, arm forward. Pull arm back, elbow straight. Repeat _5-10___ times per set. Do _1-2___ sessions per day.  Strengthening: Resisted External Rotation    Hold tubing in right hand, elbow at side and forearm across body. Rotate forearm out. Repeat _5-10___ times per set. Do __1-2__ sessions per day.

## 2017-04-03 NOTE — Therapy (Signed)
[place], [place], [address] Phone: [phone]   Fax:  [phone]  Physical Therapy Treatment  Patient Details  Name: Anita Frazier MRN: [id number] Date of Birth: [birth year] Referring Provider: Dr. Iran Planas  Encounter Date: 04/03/2017      PT End of Session - 04/03/17 1645    Visit Number 8   Number of Visits 9   Date for PT Re-Evaluation 04/05/17   PT Start Time 1602   PT Stop Time 1642   PT Time Calculation (min) 40 min   Activity Tolerance Patient tolerated treatment well   Behavior During Therapy [facility] for tasks assessed/performed      Past Medical History:  Diagnosis Date  . Abnormal Pap smear 1992 & 3/99   LEEP  . ASCUS (atypical squamous cells of undetermined significance) on Pap smear 08/1997  . Breast cancer ([place])   . 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 Dysplasia of cervix, low grade (CIN 1)   . Factor 5 Leiden mutation, heterozygous ([place]) 06/09/04  . Family history of breast cancer   . Irregular menses 01/2001  . Menstrual period late 11/17/10  . Pelvic pain 12/05/06    Past Surgical History:  Procedure Laterality Date  . ARTHROSCOPIC REPAIR ACL Left 2008  . BREAST RECONSTRUCTION WITH PLACEMENT OF TISSUE EXPANDER AND FLEX HD (ACELLULAR HYDRATED DERMIS) Bilateral 02/15/2017   Procedure: BILATERAL BREAST RECONSTRUCTION WITH PLACEMENT OF TISSUE EXPANDERS AND ALLODERM;  Surgeon: Irene Limbo, MD;  Location: [place];  Service: Plastics;  Laterality: Bilateral;  . LEEP  1992  . MASTECTOMY W/ SENTINEL NODE BIOPSY Bilateral 02/15/2017   Procedure: BILATERAL SKIN SPARING MASTECTOMIES WITH LEFT AXILLARY SENTINEL LYMPH NODE BIOPSY;  Surgeon: Jovita Kussmaul, MD;  Location: [place];  Service: General;  Laterality: Bilateral;    There were no vitals filed for this visit.      Subjective Assessment - 04/03/17 1604    Subjective I  started back to work.  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  I'm in and out of the car a lot and carry equipment.  My equipment's not terribly heavy.  I did have to carry a speaker today, but I didn't have any problem with it. Has questions about three of the strength ABC program exercises.   Currently in Pain? No/denies                         [facility] Adult PT Treatment/Exercise - 04/03/17 0001      Lumbar Exercises: Standing   Other Standing Lumbar Exercises reviewed proper technique for doing RDL exercise from Strength ABC program      Knee/Hip Exercises: Standing   Forward Step Up Hand Hold: 0;Step Height: 8"  reviewed to correct technique for strength ABC   Forward Step Up Limitations held 3 lb. weights in hands   Step Down Right;Left;Hand Hold: 0;Step Height: 8"  suggested this for alternative for step-ups     Shoulder Exercises: Supine   Other Supine Exercises reviewed lower trunk rotation in hooklying: pt. wasn't feeling much stretch, so had her put arms out to the sides and then bring knees higher to chest with feet off floor to get more stretch.     Shoulder Exercises: Standing   Other Standing Exercises Taught and had pt. perform 3 way arm raises with 3 lbs. in each hand, 10 reps each x 2 sets  Other Standing Exercises Instructed in Rockwood 4 exercises x 10each: she did red Theraband for right shoulder and then advanced to green for left shoulder.                PT Education - 04/03/17 1642    Education provided Yes   Education Details reviewed parts of strength ABC program that she had questions on; taught 3-way arm raises and [place] for both shoulders; gave green Theraband   Person(s) Educated Patient   Methods Explanation;Demonstration;Verbal cues;Handout   Comprehension Verbalized understanding;Returned demonstration                Long Term Clinic Goals - 03/28/17 1710      CC Long Term Goal  #1   Title Pt to demonstrate  170 degrees of bilateral shoulder flexion to allow her to reach items overhead.   Baseline R 131, L 129, 03/23/17- R 168, L 169, 03/28/17- 167 R and 169 L   Time 4   Period Weeks   Status On-going     CC Long Term Goal  #2   Title Pt to demonstrate 170 degrees of bilateral shoulder abduction to allow pt to reach out to sides.   Baseline R 105, L 104, 03/23/17- R 151, L 137, 03/28/17- 171 R, 179 L   Time 4   Period Weeks   Status Achieved     CC Long Term Goal  #3   Title Pt to demonstrate 85 degrees of left shoulder external rotation to allow her to return to prior level of function.   Baseline 65, 03/23/17- 90   Time 4   Period Weeks   Status Achieved     CC Long Term Goal  #4   Title Pt to be able to independently verbalize lymphedema risk reduction practices   Baseline 03/23/17- pt able to independently verbalize   Time 4   Period Weeks   Status Achieved     CC Long Term Goal  #5   Title Pt to be independent in a home exercise program for continued strengthening and stretching   Time 4   Period Weeks   Status On-going            Plan - 04/03/17 1646    Clinical Impression Statement Pt. did well with new exercises taught today and she did need review/correction of 3 exercises from strength ABC program taught last time.  Also suggested she continue stretching exercises until after her implant placement surgery.  Should be ready for discharge next visit.   Rehab Potential Excellent   PT Frequency 2x / week   PT Duration 4 weeks   PT Treatment/Interventions ADLs/Self Care Home Management;Therapeutic exercise;Patient/family education;Passive range of motion;Scar mobilization;Manual techniques   PT Next Visit Plan check that she has met all goals; review 3 way arm raises and Rockwood prn   PT Home Exercise Plan post op breast cancer, supine dowel, supine scapular exercises, strength ABC, 3-way arm raises, Rockwood   Consulted and Agree with Plan of Care Patient      Patient  will benefit from skilled therapeutic intervention in order to improve the following deficits and impairments:  Decreased knowledge of precautions, Decreased range of motion, Decreased strength, Impaired UE functional use, Postural dysfunction, Decreased scar mobility, Increased fascial restricitons  Visit Diagnosis: Stiffness of left shoulder, not elsewhere classified  Stiffness of right shoulder, not elsewhere classified  Abnormal posture  Muscle weakness (generalized)     Problem List Patient Active Problem  List   Diagnosis Date Noted  . 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 Genetic testing 01/17/2017  . Family history of breast cancer 01/11/2017  . Carcinoma of upper-outer quadrant of left breast in female, estrogen receptor negative ([place]) 12/27/2016  . Factor 5 Leiden mutation, heterozygous ([place])   . Dysplasia of cervix, low grade (CIN 1)     SALISBURY,DONNA 04/03/2017, 4:48 PM  [place], [place], [address] Phone: [phone]   Fax:  [phone]  Name: YUMI INSALACO MRN: [id number] Date of Birth: Nov 24, 1965  Serafina Royals, PT 04/03/17 4:49 PM

## 2017-04-05 ENCOUNTER — Ambulatory Visit: Payer: 59 | Admitting: Physical Therapy

## 2017-04-05 ENCOUNTER — Encounter: Payer: Self-pay | Admitting: Physical Therapy

## 2017-04-05 DIAGNOSIS — R293 Abnormal posture: Secondary | ICD-10-CM

## 2017-04-05 DIAGNOSIS — M25611 Stiffness of right shoulder, not elsewhere classified: Secondary | ICD-10-CM

## 2017-04-05 DIAGNOSIS — M6281 Muscle weakness (generalized): Secondary | ICD-10-CM

## 2017-04-05 DIAGNOSIS — M25612 Stiffness of left shoulder, not elsewhere classified: Secondary | ICD-10-CM

## 2017-04-05 NOTE — Therapy (Signed)
[place], [place], [address] Phone: [phone]   Fax:  [phone]  Physical Therapy Treatment  Patient Details  Name: Anita Frazier MRN: [id number] Date of Birth: October 07, 1965 Referring Provider: Dr. Iran Planas  Encounter Date: 04/05/2017      PT End of Session - 04/05/17 1649    Visit Number 9   Number of Visits 9   Date for PT Re-Evaluation 04/05/17   PT Start Time 8916   PT Stop Time 1640   PT Time Calculation (min) 43 min   Activity Tolerance Patient tolerated treatment well   Behavior During Therapy [facility] for tasks assessed/performed      Past Medical History:  Diagnosis Date  . Abnormal Pap smear 1992 & 3/99   LEEP  . ASCUS (atypical squamous cells of undetermined significance) on Pap smear 08/1997  . Breast cancer ([place])   . 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 Dysplasia of cervix, low grade (CIN 1)   . Factor 5 Leiden mutation, heterozygous ([place]) 06/09/04  . Family history of breast cancer   . Irregular menses 01/2001  . Menstrual period late 11/17/10  . Pelvic pain 12/05/06    Past Surgical History:  Procedure Laterality Date  . ARTHROSCOPIC REPAIR ACL Left 2008  . BREAST RECONSTRUCTION WITH PLACEMENT OF TISSUE EXPANDER AND FLEX HD (ACELLULAR HYDRATED DERMIS) Bilateral 02/15/2017   Procedure: BILATERAL BREAST RECONSTRUCTION WITH PLACEMENT OF TISSUE EXPANDERS AND ALLODERM;  Surgeon: Irene Limbo, MD;  Location: [place];  Service: Plastics;  Laterality: Bilateral;  . LEEP  1992  . MASTECTOMY W/ SENTINEL NODE BIOPSY Bilateral 02/15/2017   Procedure: BILATERAL SKIN SPARING MASTECTOMIES WITH LEFT AXILLARY SENTINEL LYMPH NODE BIOPSY;  Surgeon: Jovita Kussmaul, MD;  Location: [place];  Service: General;  Laterality: Bilateral;    There were no vitals filed for this visit.      Subjective Assessment - 04/05/17 1559    Subjective I  think the exercises are going pretty good. I don't have any questions with the new exercises.    Pertinent History Biopsy of the left breast upper outer quadrant mass 12/11/2016 showed ([id number]) ductal carcinoma in situ, high-grade, with areas highly suspicious for invasion. There are 2 prognstic panels, both estrogen and progesterone receptor negative, with MIB-1 between 10 and 15%. The tumor was HER-2 amplified, with a signals ratio 4.25 and the number per cell 9.35, s/p 02/15/17 bilateral mastectomies with L SNLB,   Patient Stated Goals to return to my normal ROM   Currently in Pain? No/denies   Pain Score 0-No pain                         [facility] Adult PT Treatment/Exercise - 04/05/17 0001      Lumbar Exercises: Supine   Other Supine Lumbar Exercises pelvic tilts with 5 sec holds x 10, pelvic tilts with clam shells x 10, pelvic tilts with marching x 10, pelvic tilts with SLR x 10, pelvic tilt with both legs extended x 3, seated holding 7 lb dumbell turning side to side while keeping core tight x 10 reps     Shoulder Exercises: Standing   Other Standing Exercises 3 way arm raises with 3 lbs. in each hand, 10 reps each x 2 sets   Other Standing Exercises Instructed in Rockwood 4 exercises x 10each with green theraband     Manual Therapy   Myofascial Release  Bil UE pulling   Passive ROM In Supine to bil shoulders into abduction and IR  removed pillow for more of a stretch                        Long Term Clinic Goals - 04/05/17 1640      CC Long Term Goal  #1   Title Pt to demonstrate 170 degrees of bilateral shoulder flexion to allow her to reach items overhead.   Baseline R 131, L 129, 03/23/17- R 168, L 169, 03/28/17- 167 R and 169 L, 04/05/17- R 171 L 172   Time 4   Period Weeks   Status Achieved     CC Long Term Goal  #2   Title Pt to demonstrate 170 degrees of bilateral shoulder abduction to allow pt to reach out to sides.   Baseline R 105, L  104, 03/23/17- R 151, L 137, 03/28/17- 171 R, 179 L   Time 4   Period Weeks   Status Achieved     CC Long Term Goal  #3   Title Pt to demonstrate 85 degrees of left shoulder external rotation to allow her to return to prior level of function.   Baseline 65, 03/23/17- 90   Time 4   Period Weeks   Status Achieved     CC Long Term Goal  #4   Title Pt to be able to independently verbalize lymphedema risk reduction practices   Baseline 03/23/17- pt able to independently verbalize   Time 4   Period Weeks   Status Achieved     CC Long Term Goal  #5   Title Pt to be independent in a home exercise program for continued strengthening and stretching   Time 4   Period Weeks   Status Achieved            Plan - 04/05/17 1650    Clinical Impression Statement Pt is now independent with her home exercise program and has met all goals for therapy. She is able to progress exercises at home and plans on starting the [place] program at the [facility]. Pt will be discharged from skilled PT services at this time.    Rehab Potential Excellent   PT Frequency 2x / week   PT Duration 4 weeks   PT Treatment/Interventions ADLs/Self Care Home Management;Therapeutic exercise;Patient/family education;Passive range of motion;Scar mobilization;Manual techniques   PT Next Visit Plan dc this visit   PT Home Exercise Plan post op breast cancer, supine dowel, supine scapular exercises, strength ABC, 3-way arm raises, [place]   Consulted and Agree with Plan of Care Patient      Patient will benefit from skilled therapeutic intervention in order to improve the following deficits and impairments:  Decreased knowledge of precautions, Decreased range of motion, Decreased strength, Impaired UE functional use, Postural dysfunction, Decreased scar mobility, Increased fascial restricitons  Visit Diagnosis: Stiffness of left shoulder, not elsewhere classified  Stiffness of right shoulder, not elsewhere  classified  Abnormal posture  Muscle weakness (generalized)     Problem List Patient Active Problem List   Diagnosis Date Noted  . 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 Genetic testing 01/17/2017  . Family history of breast cancer 01/11/2017  . Carcinoma of upper-outer quadrant of left breast in female, estrogen receptor negative ([place]) 12/27/2016  . Factor 5 Leiden mutation, heterozygous ([place])   . Dysplasia of  cervix, low grade (CIN 1)     Allyson Sabal [facility] 04/05/2017, 4:54 PM  [place], [place], [address] Phone: [phone]   Fax:  [phone]  Name: TUNYA HELD MRN: [id number] Date of Birth: 08-06-1965  PHYSICAL THERAPY DISCHARGE SUMMARY  Visits from Start of Care: 9  Current functional level related to goals / functional outcomes: See above   Remaining deficits: None   Education / Equipment: HEP  Plan: Patient agrees to discharge.  Patient goals were met. Patient is being discharged due to meeting the stated rehab goals.  ?????    Allyson Sabal [place], [place] 04/05/17 4:55 PM

## 2017-06-06 NOTE — Progress Notes (Signed)
CLINIC:  Survivorship   REASON FOR VISIT:  Routine follow-up post-treatment for a recent history of breast cancer.  BRIEF ONCOLOGIC HISTORY:    Carcinoma of upper-outer quadrant of left breast in female, estrogen receptor negative ([place])   12/11/2016 Initial Biopsy    Left breast upper outer quadrant biopsy: DCIS, high grade,with areas concerning for invasion.  ER/PR negative, HER-2 +, Ki-67 10-15%      12/27/2016 Initial Diagnosis    Carcinoma of upper-outer quadrant of left breast in female, estrogen receptor negative ([place])      01/16/2017 Genetic Testing    Ms. Amy had genetic testing due to a personal and family history of Breast Cancer.  She was tested for the Breast Cancer STAT panel. The STAT Breast cancer panel offered by [facility] includes sequencing and rearrangement analysis for the following 9 genes:  ATM, BRCA1, BRCA2, CDH1, CHEK2, PALB2, PTEN, STK11 and TP53.    Results: Negative. No pathogenic mutations or variants of uncertain significance were identified.   The date of this report is 01/16/2017.  Reflex testing to the Common Hereditary Cancer Panel (46 genes) was ordered and is pending.       01/31/2017 Genetic Testing    Ms. Deats had genetic testing due to a personal and family history of Breast Cancer.  She was tested for the Breast Cancer STAT with reflex to the [facility] Common Hereditary Cancers Panel (total of 46 genes).  The Hereditary Gene Panel offered by [facility] includes sequencing and/or deletion duplication testing of the following 46 genes: APC, ATM, AXIN2, BARD1, BMPR1A, BRCA1, BRCA2, BRIP1, CDH1, CDKN2A (p14ARF), CDKN2A (p16INK4a), CHEK2, CTNNA1, DICER1, EPCAM (Deletion/duplication testing only), GREM1 (promoter region deletion/duplication testing only), KIT, MEN1, MLH1, MSH2, MSH3, MSH6, MUTYH, NBN, NF1, NHTL1, PALB2, PDGFRA, PMS2, POLD1, POLE, PTEN, RAD50, RAD51C, RAD51D, SDHB, SDHC, SDHD, SMAD4, SMARCA4. STK11, TP53, TSC1, TSC2, and VHL.  The following genes  were evaluated for sequence changes only: SDHA and HOXB13 c.251G>A variant only.  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      02/15/2017 Surgery    Bilateral mastectomies Marlou Starks): right-benign, left Microinvasive ductal carcinoma arising in a background of high grade DCIS, margins neg. 2 SLN neg.        INTERVAL HISTORY:  Ms. Guinta presents to the [facility] today for our initial meeting to review her survivorship care plan detailing her treatment course for breast cancer, as well as monitoring long-term side effects of that treatment, education regarding health maintenance, screening, and overall wellness and health promotion.     Overall, Ms. Mayor reports feeling quite well.  She denies any issues and has been undergoing plastic surgery process with Dr. Iran Planas and is quite happy with her care from her.      REVIEW OF SYSTEMS:  Review of Systems  Constitutional: Negative for appetite change, chills, fatigue, fever and unexpected weight change.  HENT:   Negative for hearing loss and lump/mass.   Eyes: Negative for eye problems and icterus.  Respiratory: Negative for chest tightness, cough, shortness of breath and wheezing.   Cardiovascular: Negative for chest pain, leg swelling and palpitations.  Gastrointestinal: Negative for abdominal distention, abdominal pain, constipation, diarrhea, nausea and vomiting.  Endocrine: Negative for hot flashes.  Genitourinary: Negative for difficulty urinating.   Skin: Negative for rash.  Neurological: Negative for dizziness, extremity weakness, headaches and numbness.  Hematological: Negative for adenopathy. Does not bruise/bleed easily.  Psychiatric/Behavioral: Negative for depression. The patient is not nervous/anxious.   Breast: Denies any new nodularity, masses, tenderness, nipple changes, or nipple discharge.      ONCOLOGY TREATMENT  TEAM:  1. Surgeon:  Dr. Marlou Starks at [facility] 2. Medical Oncologist: Dr. Jana Hakim  3. Radiation Oncologist: Dr. Isidore Moos    PAST MEDICAL/SURGICAL HISTORY:  Past Medical History:  Diagnosis Date  . Abnormal Pap smear 1992 & 3/99   LEEP  . ASCUS (atypical squamous cells of undetermined significance) on Pap smear 08/1997  . Breast cancer ([place])   . 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 Dysplasia of cervix, low grade (CIN 1)   . Factor 5 Leiden mutation, heterozygous ([place]) 06/09/04  . Family history of breast cancer   . Irregular menses 01/2001  . Menstrual period late 11/17/10  . Pelvic pain 12/05/06   Past Surgical History:  Procedure Laterality Date  . ARTHROSCOPIC REPAIR ACL Left 2008  . BREAST RECONSTRUCTION WITH PLACEMENT OF TISSUE EXPANDER AND FLEX HD (ACELLULAR HYDRATED DERMIS) Bilateral 02/15/2017   Procedure: BILATERAL BREAST RECONSTRUCTION WITH PLACEMENT OF TISSUE EXPANDERS AND ALLODERM;  Surgeon: Irene Limbo, MD;  Location: [place];  Service: Plastics;  Laterality: Bilateral;  . LEEP  1992  . MASTECTOMY W/ SENTINEL NODE BIOPSY Bilateral 02/15/2017   Procedure: BILATERAL SKIN SPARING MASTECTOMIES WITH LEFT AXILLARY SENTINEL LYMPH NODE BIOPSY;  Surgeon: Jovita Kussmaul, MD;  Location: [place];  Service: General;  Laterality: Bilateral;     ALLERGIES:  No Known Allergies   CURRENT MEDICATIONS:  Outpatient Encounter Medications as of 06/07/2017  Medication Sig  . [DISCONTINUED] methocarbamol (ROBAXIN) 500 MG tablet Take 1 tablet (500 mg total) by mouth every 8 (eight) hours as needed for muscle spasms. (Patient not taking: Reported on 03/13/2017)   No facility-administered encounter medications on file as of 06/07/2017.      ONCOLOGIC FAMILY HISTORY:  Family History  Problem Relation Age of Onset  . Heart disease Father 16       MI  . Breast cancer Maternal Aunt 83       now is 11  . Hypertension Mother          chronic  . COPD Son        Respiratory affective disease   . Diabetes Maternal Grandmother   . Cancer Maternal Grandmother 15       type unknown  . Breast cancer Maternal Aunt 70       now is 65     GENETIC COUNSELING/TESTING: MUTYH mutation, discussion about colonoscopy today   PHYSICAL EXAMINATION:  Vital Signs:   Vitals:   06/07/17 1001  BP: (!) 143/80  Pulse: 62  Resp: 20  Temp: 98.9 F (37.2 C)  SpO2: 99%   Filed Weights   06/07/17 1001  Weight: 115 lb 9.6 oz (52.4 kg)   General: Well-nourished, well-appearing female in no acute distress.  She is unaccompanied today.   HEENT: Head is normocephalic.  Pupils equal and reactive to light. Conjunctivae clear without exudate.  Sclerae anicteric. Oral mucosa is pink, moist.  Oropharynx is pink without lesions or erythema.  Lymph: No cervical, supraclavicular, or infraclavicular lymphadenopathy noted on palpation.  Cardiovascular: Regular rate and rhythm.Marland Kitchen Respiratory: Clear to auscultation bilaterally. Chest expansion symmetric; breathing non-labored.  GI: Abdomen soft and round; non-tender, non-distended. Bowel sounds normoactive.  GU: Deferred.  Neuro: No focal deficits. Steady gait.  Psych: Mood and affect normal and appropriate for situation.  Extremities: No edema. MSK: No focal spinal tenderness to palpation.  Full range of motion in bilateral upper extremities Skin: Warm and dry.  LABORATORY DATA:  None for this visit.  DIAGNOSTIC IMAGING:  None for this visit.      ASSESSMENT AND PLAN:  Ms.. Southgate is a pleasant 51 y.o. 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  She presents to the Survivorship Clinic for our initial meeting and routine follow-up post-completion of treatment for breast cancer.    1. Stage IA left breast cancer:  Ms. Madarang is continuing to recover from definitive treatment for  breast cancer. She will follow-up with her medical oncologist, Dr. Jana Hakim in 02/2018 with history and physical exam per surveillance protocol.  SToday, a comprehensive survivorship care plan and treatment summary was reviewed with the patient today detailing her breast cancer diagnosis, treatment course, potential late/long-term effects of treatment, appropriate follow-up care with recommendations for the future, and patient education resources.  A copy of this summary, along with a letter will be sent to the patient's primary care provider via mail/fax/In Basket message after today's visit.    2. Cancer screening:  Due to Ms. Toral's history and her age, she should receive screening for skin cancers, colon cancer, and gynecologic cancers.  The information and recommendations are listed on the patient's comprehensive care plan/treatment summary and were reviewed in detail with the patient.    3. Health maintenance and wellness promotion: Ms. Yim was encouraged to consume 5-7 servings of fruits and vegetables per day. We reviewed the "Nutrition Rainbow" handout, as well as the handout "Take Control of Your Health and Reduce Your Cancer Risk" from the [place].  She was also encouraged to engage in moderate to vigorous exercise for 30 minutes per day most days of the week. We discussed the [employer] fitness program, which is designed for cancer survivors to help them become more physically fit after cancer treatments.  She was instructed to limit her alcohol consumption and continue to abstain from tobacco use.     4. Support services/counseling: It is not uncommon for this period of the patient's cancer care trajectory to be one of many emotions and stressors.  We discussed an opportunity for her to participate in the next session of [facility] ("Finding Your New Normal") support group series designed for patients after they have completed treatment.   Ms. Abram was encouraged to take advantage  of our many other support services programs, support groups, and/or counseling in coping with her new life as a cancer survivor after completing anti-cancer treatment.  She was offered support today through active listening and expressive supportive counseling.  She was given information regarding our available services and encouraged to contact me with any questions or for help enrolling in any of our support group/programs.    Dispo:   -Return to cancer center for f/u with Dr. Jana Hakim in 02/2018 -Follow up with surgery 11/2017 -She is welcome to return back to the Survivorship Clinic at any time; no additional follow-up needed at this time.  -Consider referral back to survivorship as a long-term survivor for continued surveillance  A total of (30) minutes of face-to-face time was spent with this patient with greater than 50% of that time in counseling and care-coordination.   Gardenia Phlegm, NP Survivorship Program [facility] [phone]   Note: PRIMARY CARE PROVIDER Delsa Bern, [place] [phone]

## 2017-06-07 ENCOUNTER — Encounter: Payer: Self-pay | Admitting: Adult Health

## 2017-06-07 ENCOUNTER — Ambulatory Visit (HOSPITAL_BASED_OUTPATIENT_CLINIC_OR_DEPARTMENT_OTHER): Payer: 59 | Admitting: Adult Health

## 2017-06-07 VITALS — BP 143/80 | HR 62 | Temp 98.9°F | Resp 20 | Ht 60.0 in | Wt 115.6 lb

## 2017-06-07 DIAGNOSIS — Z171 Estrogen receptor negative status [ER-]: Principal | ICD-10-CM

## 2017-06-07 DIAGNOSIS — C50412 Malignant neoplasm of upper-outer quadrant of left female breast: Secondary | ICD-10-CM

## 2017-06-07 DIAGNOSIS — Z86000 Personal history of in-situ neoplasm of breast: Secondary | ICD-10-CM

## 2017-06-22 ENCOUNTER — Encounter (HOSPITAL_BASED_OUTPATIENT_CLINIC_OR_DEPARTMENT_OTHER): Payer: Self-pay | Admitting: *Deleted

## 2017-06-26 NOTE — Progress Notes (Signed)
Ensure pre surgery drink given with instructions, pt verbalized understanding. 

## 2017-06-29 ENCOUNTER — Other Ambulatory Visit: Payer: Self-pay

## 2017-06-29 ENCOUNTER — Encounter (HOSPITAL_BASED_OUTPATIENT_CLINIC_OR_DEPARTMENT_OTHER)
Admission: RE | Disposition: A | Discharge status: Home / Self Care | Payer: Self-pay | Source: Ambulatory Visit | Attending: Plastic Surgery

## 2017-06-29 ENCOUNTER — Encounter (HOSPITAL_BASED_OUTPATIENT_CLINIC_OR_DEPARTMENT_OTHER): Payer: Self-pay | Admitting: *Deleted

## 2017-06-29 ENCOUNTER — Ambulatory Visit (HOSPITAL_BASED_OUTPATIENT_CLINIC_OR_DEPARTMENT_OTHER)
Admission: RE | Admit: 2017-06-29 | Discharge: 2017-06-29 | Disposition: A | Discharge status: Home / Self Care | Payer: 59 | Source: Ambulatory Visit | Attending: Plastic Surgery | Admitting: Plastic Surgery

## 2017-06-29 ENCOUNTER — Ambulatory Visit (HOSPITAL_BASED_OUTPATIENT_CLINIC_OR_DEPARTMENT_OTHER): Payer: 59 | Admitting: Certified Registered Nurse Anesthetist

## 2017-06-29 DIAGNOSIS — Z853 Personal history of malignant neoplasm of breast: Secondary | ICD-10-CM | POA: Insufficient documentation

## 2017-06-29 DIAGNOSIS — Z421 Encounter for breast reconstruction following mastectomy: Secondary | ICD-10-CM | POA: Insufficient documentation

## 2017-06-29 DIAGNOSIS — D6851 Activated protein C resistance: Secondary | ICD-10-CM | POA: Insufficient documentation

## 2017-06-29 DIAGNOSIS — Z9013 Acquired absence of bilateral breasts and nipples: Secondary | ICD-10-CM | POA: Insufficient documentation

## 2017-06-29 DIAGNOSIS — N870 Mild cervical dysplasia: Secondary | ICD-10-CM | POA: Diagnosis not present

## 2017-06-29 HISTORY — PX: REMOVAL OF BILATERAL TISSUE EXPANDERS WITH PLACEMENT OF BILATERAL BREAST IMPLANTS: SHX6431

## 2017-06-29 HISTORY — PX: LIPOSUCTION WITH LIPOFILLING: SHX6436

## 2017-06-29 SURGERY — REMOVAL, TISSUE EXPANDER, BREAST, BILATERAL, WITH BILATERAL IMPLANT IMPLANT INSERTION
Anesthesia: General | Site: Breast | Laterality: Bilateral

## 2017-06-29 MED ORDER — EPINEPHRINE 30 MG/30ML IJ SOLN
INTRAMUSCULAR | Status: AC
  Filled 2017-06-29: qty 1

## 2017-06-29 MED ORDER — CHLORHEXIDINE GLUCONATE CLOTH 2 % EX PADS: 6.0000 | MEDICATED_PAD | Freq: Once | CUTANEOUS | Status: DC

## 2017-06-29 MED ORDER — SCOPOLAMINE 1 MG/3DAYS TD PT72
1.0000 | MEDICATED_PATCH | Freq: Once | TRANSDERMAL | Status: DC | PRN
Start: 2017-06-29 — End: 2017-06-29

## 2017-06-29 MED ORDER — LACTATED RINGERS IV SOLN
INTRAVENOUS | Status: DC
  Administered 2017-06-29 (×2): via INTRAVENOUS

## 2017-06-29 MED ORDER — GABAPENTIN 300 MG PO CAPS
ORAL_CAPSULE | ORAL | Status: AC
  Filled 2017-06-29: qty 1

## 2017-06-29 MED ORDER — SULFAMETHOXAZOLE-TRIMETHOPRIM 800-160 MG PO TABS: 1.0000 | ORAL_TABLET | Freq: Two times a day (BID) | ORAL | 0 refills | Status: AC

## 2017-06-29 MED ORDER — BUPIVACAINE-EPINEPHRINE (PF) 0.25% -1:200000 IJ SOLN
INTRAMUSCULAR | Status: AC
  Filled 2017-06-29: qty 90

## 2017-06-29 MED ORDER — PROPOFOL 10 MG/ML IV BOLUS
INTRAVENOUS | Status: DC | PRN
  Administered 2017-06-29: 180 mg via INTRAVENOUS

## 2017-06-29 MED ORDER — ACETAMINOPHEN 500 MG PO TABS
ORAL_TABLET | ORAL | Status: AC
  Filled 2017-06-29: qty 2

## 2017-06-29 MED ORDER — CEFAZOLIN SODIUM-DEXTROSE 2-4 GM/100ML-% IV SOLN
2.0000 g | INTRAVENOUS | Status: AC
  Administered 2017-06-29: 2 g via INTRAVENOUS

## 2017-06-29 MED ORDER — HYDROCODONE-ACETAMINOPHEN 5-325 MG PO TABS: 1.0000 | ORAL_TABLET | ORAL | 0 refills | Status: AC | PRN

## 2017-06-29 MED ORDER — ROCURONIUM BROMIDE 10 MG/ML (PF) SYRINGE
PREFILLED_SYRINGE | INTRAVENOUS | Status: AC
  Filled 2017-06-29: qty 5

## 2017-06-29 MED ORDER — MIDAZOLAM HCL 2 MG/2ML IJ SOLN
INTRAMUSCULAR | Status: AC
  Filled 2017-06-29: qty 2

## 2017-06-29 MED ORDER — SUGAMMADEX SODIUM 200 MG/2ML IV SOLN
INTRAVENOUS | Status: AC
  Filled 2017-06-29: qty 2

## 2017-06-29 MED ORDER — FENTANYL CITRATE (PF) 100 MCG/2ML IJ SOLN
INTRAMUSCULAR | Status: AC
  Filled 2017-06-29: qty 2

## 2017-06-29 MED ORDER — SUGAMMADEX SODIUM 200 MG/2ML IV SOLN
INTRAVENOUS | Status: DC | PRN
  Administered 2017-06-29: 104.2 mg via INTRAVENOUS

## 2017-06-29 MED ORDER — FENTANYL CITRATE (PF) 100 MCG/2ML IJ SOLN: 50.0000 ug | INTRAMUSCULAR | Status: DC | PRN

## 2017-06-29 MED ORDER — CEFAZOLIN SODIUM-DEXTROSE 2-4 GM/100ML-% IV SOLN
INTRAVENOUS | Status: AC
  Filled 2017-06-29: qty 100

## 2017-06-29 MED ORDER — PHENYLEPHRINE 40 MCG/ML (10ML) SYRINGE FOR IV PUSH (FOR BLOOD PRESSURE SUPPORT)
PREFILLED_SYRINGE | INTRAVENOUS | Status: AC
  Filled 2017-06-29: qty 10

## 2017-06-29 MED ORDER — GENTAMICIN SULFATE 40 MG/ML IJ SOLN
INTRAMUSCULAR | Status: DC | PRN
  Administered 2017-06-29: 1000 mL

## 2017-06-29 MED ORDER — EPHEDRINE SULFATE 50 MG/ML IJ SOLN
INTRAMUSCULAR | Status: DC | PRN
  Administered 2017-06-29: 15 mg via INTRAVENOUS

## 2017-06-29 MED ORDER — GABAPENTIN 300 MG PO CAPS
300.0000 mg | ORAL_CAPSULE | ORAL | Status: AC
  Administered 2017-06-29: 300 mg via ORAL

## 2017-06-29 MED ORDER — PROPOFOL 10 MG/ML IV BOLUS
INTRAVENOUS | Status: AC
  Filled 2017-06-29: qty 20

## 2017-06-29 MED ORDER — ONDANSETRON HCL 4 MG/2ML IJ SOLN
INTRAMUSCULAR | Status: AC
  Filled 2017-06-29: qty 2

## 2017-06-29 MED ORDER — ACETAMINOPHEN 500 MG PO TABS
1000.0000 mg | ORAL_TABLET | ORAL | Status: AC
  Administered 2017-06-29: 1000 mg via ORAL

## 2017-06-29 MED ORDER — MIDAZOLAM HCL 5 MG/5ML IJ SOLN
INTRAMUSCULAR | Status: DC | PRN
  Administered 2017-06-29: 2 mg via INTRAVENOUS

## 2017-06-29 MED ORDER — SUCCINYLCHOLINE CHLORIDE 200 MG/10ML IV SOSY
PREFILLED_SYRINGE | INTRAVENOUS | Status: AC
  Filled 2017-06-29: qty 10

## 2017-06-29 MED ORDER — CELECOXIB 200 MG PO CAPS
200.0000 mg | ORAL_CAPSULE | ORAL | Status: AC
  Administered 2017-06-29: 200 mg via ORAL

## 2017-06-29 MED ORDER — ONDANSETRON HCL 4 MG/2ML IJ SOLN
INTRAMUSCULAR | Status: DC | PRN
  Administered 2017-06-29: 4 mg via INTRAVENOUS

## 2017-06-29 MED ORDER — EPHEDRINE SULFATE 50 MG/ML IJ SOLN
INTRAMUSCULAR | Status: AC
  Filled 2017-06-29: qty 1

## 2017-06-29 MED ORDER — DEXAMETHASONE SODIUM PHOSPHATE 10 MG/ML IJ SOLN
INTRAMUSCULAR | Status: AC
  Filled 2017-06-29: qty 1

## 2017-06-29 MED ORDER — LIDOCAINE HCL (PF) 1 % IJ SOLN
INTRAMUSCULAR | Status: AC
  Filled 2017-06-29: qty 120

## 2017-06-29 MED ORDER — SODIUM BICARBONATE 4 % IV SOLN
INTRAVENOUS | Status: DC | PRN
  Administered 2017-06-29: 300 mL via INTRAMUSCULAR

## 2017-06-29 MED ORDER — DEXAMETHASONE SODIUM PHOSPHATE 4 MG/ML IJ SOLN
INTRAMUSCULAR | Status: DC | PRN
  Administered 2017-06-29: 10 mg via INTRAVENOUS

## 2017-06-29 MED ORDER — MIDAZOLAM HCL 2 MG/2ML IJ SOLN: 1.0000 mg | INTRAMUSCULAR | Status: DC | PRN

## 2017-06-29 MED ORDER — LIDOCAINE 2% (20 MG/ML) 5 ML SYRINGE
INTRAMUSCULAR | Status: AC
  Filled 2017-06-29: qty 5

## 2017-06-29 MED ORDER — CELECOXIB 200 MG PO CAPS
ORAL_CAPSULE | ORAL | Status: AC
  Filled 2017-06-29: qty 1

## 2017-06-29 MED ORDER — SULFAMETHOXAZOLE-TRIMETHOPRIM 800-160 MG PO TABS: 1.0000 | ORAL_TABLET | Freq: Two times a day (BID) | ORAL | 0 refills | Status: DC

## 2017-06-29 MED ORDER — LIDOCAINE-EPINEPHRINE 1 %-1:100000 IJ SOLN
INTRAMUSCULAR | Status: AC
  Filled 2017-06-29: qty 2

## 2017-06-29 MED ORDER — FENTANYL CITRATE (PF) 100 MCG/2ML IJ SOLN
INTRAMUSCULAR | Status: DC | PRN
  Administered 2017-06-29 (×2): 50 ug via INTRAVENOUS

## 2017-06-29 MED ORDER — FENTANYL CITRATE (PF) 100 MCG/2ML IJ SOLN
25.0000 ug | INTRAMUSCULAR | Status: DC | PRN
  Administered 2017-06-29: 50 ug via INTRAVENOUS

## 2017-06-29 MED ORDER — LIDOCAINE 2% (20 MG/ML) 5 ML SYRINGE
INTRAMUSCULAR | Status: DC | PRN
  Administered 2017-06-29: 60 mg via INTRAVENOUS

## 2017-06-29 MED ORDER — SODIUM BICARBONATE 4 % IV SOLN
INTRAVENOUS | Status: AC
  Filled 2017-06-29: qty 20

## 2017-06-29 SURGICAL SUPPLY — 82 items
ADH SKN CLS APL DERMABOND .7 (GAUZE/BANDAGES/DRESSINGS) ×4
BAG DECANTER FOR FLEXI CONT (MISCELLANEOUS) ×4 IMPLANT
BINDER ABDOMINAL  9 SM 30-45 (SOFTGOODS) ×2
BINDER ABDOMINAL 10 UNV 27-48 (MISCELLANEOUS) ×2 IMPLANT
BINDER ABDOMINAL 12 SM 30-45 (SOFTGOODS) IMPLANT
BINDER ABDOMINAL 9 SM 30-45 (SOFTGOODS) IMPLANT
BINDER BREAST LRG (GAUZE/BANDAGES/DRESSINGS) IMPLANT
BINDER BREAST MEDIUM (GAUZE/BANDAGES/DRESSINGS) ×2 IMPLANT
BINDER BREAST XLRG (GAUZE/BANDAGES/DRESSINGS) IMPLANT
BINDER BREAST XXLRG (GAUZE/BANDAGES/DRESSINGS) IMPLANT
BLADE SURG 10 STRL SS (BLADE) ×6 IMPLANT
BLADE SURG 11 STRL SS (BLADE) ×4 IMPLANT
BNDG GAUZE ELAST 4 BULKY (GAUZE/BANDAGES/DRESSINGS) ×8 IMPLANT
CANISTER LIPO FAT HARVEST (MISCELLANEOUS) ×4 IMPLANT
CANISTER SUCT 1200ML W/VALVE (MISCELLANEOUS) ×4 IMPLANT
CHLORAPREP W/TINT 26ML (MISCELLANEOUS) ×6 IMPLANT
COVER BACK TABLE 60X90IN (DRAPES) ×4 IMPLANT
COVER MAYO STAND STRL (DRAPES) ×4 IMPLANT
DECANTER SPIKE VIAL GLASS SM (MISCELLANEOUS) IMPLANT
DERMABOND ADVANCED (GAUZE/BANDAGES/DRESSINGS) ×4
DERMABOND ADVANCED .7 DNX12 (GAUZE/BANDAGES/DRESSINGS) ×4 IMPLANT
DRAIN CHANNEL 15F RND FF W/TCR (WOUND CARE) IMPLANT
DRAPE TOP ARMCOVERS (MISCELLANEOUS) ×4 IMPLANT
DRAPE U-SHAPE 76X120 STRL (DRAPES) ×4 IMPLANT
DRAPE UTILITY XL STRL (DRAPES) IMPLANT
DRSG PAD ABDOMINAL 8X10 ST (GAUZE/BANDAGES/DRESSINGS) ×8 IMPLANT
ELECT BLADE 4.0 EZ CLEAN MEGAD (MISCELLANEOUS)
ELECT COATED BLADE 2.86 ST (ELECTRODE) ×4 IMPLANT
ELECT REM PT RETURN 9FT ADLT (ELECTROSURGICAL) ×4
ELECTRODE BLDE 4.0 EZ CLN MEGD (MISCELLANEOUS) ×2 IMPLANT
ELECTRODE REM PT RTRN 9FT ADLT (ELECTROSURGICAL) ×2 IMPLANT
EVACUATOR SILICONE 100CC (DRAIN) IMPLANT
GLOVE BIO SURGEON STRL SZ 6 (GLOVE) ×8 IMPLANT
GLOVE BIOGEL PI IND STRL 7.0 (GLOVE) IMPLANT
GLOVE BIOGEL PI INDICATOR 7.0 (GLOVE) ×4
GLOVE SURG SS PI 6.5 STRL IVOR (GLOVE) ×2 IMPLANT
GOWN STRL REUS W/ TWL LRG LVL3 (GOWN DISPOSABLE) ×4 IMPLANT
GOWN STRL REUS W/TWL LRG LVL3 (GOWN DISPOSABLE) ×8
IMPL BREAST SALINE HP 425CC (Breast) IMPLANT
IMPLANT BREAST SALINE HP 425CC (Breast) ×8 IMPLANT
IV NS 500ML (IV SOLUTION)
IV NS 500ML BAXH (IV SOLUTION) IMPLANT
IV SOD CHL 0.9% 1000ML (IV SOLUTION) ×2 IMPLANT
KIT FILL SYSTEM UNIVERSAL (SET/KITS/TRAYS/PACK) ×2 IMPLANT
LINER CANISTER 1000CC FLEX (MISCELLANEOUS) ×4 IMPLANT
MARKER SKIN DUAL TIP RULER LAB (MISCELLANEOUS) IMPLANT
NDL HYPO 25X1 1.5 SAFETY (NEEDLE) IMPLANT
NDL SAFETY ECLIPSE 18X1.5 (NEEDLE) ×2 IMPLANT
NEEDLE HYPO 18GX1.5 SHARP (NEEDLE)
NEEDLE HYPO 25X1 1.5 SAFETY (NEEDLE) IMPLANT
NS IRRIG 1000ML POUR BTL (IV SOLUTION) ×6 IMPLANT
PACK BASIN DAY SURGERY FS (CUSTOM PROCEDURE TRAY) ×4 IMPLANT
PAD ALCOHOL SWAB (MISCELLANEOUS) ×4 IMPLANT
PENCIL BUTTON HOLSTER BLD 10FT (ELECTRODE) ×4 IMPLANT
PIN SAFETY STERILE (MISCELLANEOUS) IMPLANT
PUNCH BIOPSY DERMAL 4MM (MISCELLANEOUS) IMPLANT
SHEET MEDIUM DRAPE 40X70 STRL (DRAPES) ×6 IMPLANT
SIZER BREAST SGL USE HP 425CC (SIZER) ×4
SIZER BRST SGL USE HP 425CC (SIZER) IMPLANT
SLEEVE SCD COMPRESS KNEE MED (MISCELLANEOUS) ×4 IMPLANT
SPONGE LAP 18X18 X RAY DECT (DISPOSABLE) ×8 IMPLANT
STAPLER VISISTAT 35W (STAPLE) ×6 IMPLANT
SUT ETHILON 2 0 FS 18 (SUTURE) IMPLANT
SUT MNCRL AB 4-0 PS2 18 (SUTURE) ×8 IMPLANT
SUT PDS AB 2-0 CT2 27 (SUTURE) IMPLANT
SUT VIC AB 3-0 PS1 18 (SUTURE)
SUT VIC AB 3-0 PS1 18XBRD (SUTURE) IMPLANT
SUT VIC AB 3-0 SH 27 (SUTURE) ×8
SUT VIC AB 3-0 SH 27X BRD (SUTURE) ×4 IMPLANT
SUT VICRYL 4-0 PS2 18IN ABS (SUTURE) ×8 IMPLANT
SYR 10ML LL (SYRINGE) ×14 IMPLANT
SYR 50ML LL SCALE MARK (SYRINGE) ×12 IMPLANT
SYR BULB IRRIGATION 50ML (SYRINGE) ×8 IMPLANT
SYR CONTROL 10ML LL (SYRINGE) IMPLANT
SYR TB 1ML LL NO SAFETY (SYRINGE) IMPLANT
TOWEL OR 17X24 6PK STRL BLUE (TOWEL DISPOSABLE) ×10 IMPLANT
TUBE CONNECTING 20'X1/4 (TUBING) ×2
TUBE CONNECTING 20X1/4 (TUBING) ×4 IMPLANT
TUBING INFILTRATION IT-10001 (TUBING) ×4 IMPLANT
TUBING SET GRADUATE ASPIR 12FT (MISCELLANEOUS) ×4 IMPLANT
UNDERPAD 30X30 (UNDERPADS AND DIAPERS) ×4 IMPLANT
YANKAUER SUCT BULB TIP NO VENT (SUCTIONS) ×4 IMPLANT

## 2017-06-29 NOTE — H&P (Signed)
Subjective:     Patient ID: Anita Frazier is a 51 y.o. female.  HPI  3 months post op SSM with immediate expander based reconstruction.   Presented following screening MMG with 4.3 cm pleomorphic calcifications in the left breast UOQ. US revealed a left breast mass measuring 7 cm, left axilla negative. 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 MRI noted NME throughout the LOQ measuring 7 cm extent. Final pathology, right benign, left with microinvasive ductal carcinoma in background high grade DCIS, margins negative. 0/2 SLN.  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  PMH significant for Factor V Leiden mutation heterozygous.  Prior 34 C. Right mastectomy 496 g Left 496 g      Objective:   Physical Exam  Cardiovascular: Normal rate, regular rhythm and normal heart sounds.   Pulmonary/Chest: Effort normal and breath sounds normal.  Abdominal: Soft.   Chest: scars maturing, left LIQ with depression/fold of flap CW R 12.5 L 12.4  Assessment:     Left breast UOQ DCIS highly suspicious for invasion, ER-, Her2+ S/p bilateral SSM, TE/ADM (Alloderm) reconstruction    Plan:      Plan removal expanders and placement implants. Provided [employer] booklet for review.  Discussedsaline vs silicone, round vs shaped, textured vs smooth. We reviewed risks rupture, rippling, contracture. Discussed saline does not require surveillance for rupture but may have more risk rippling. Discussed FDA recommendation for MRI surveillance with silicone implants.  Patient called, has decided on saline implants. States she does not want to be bigger than her native breasts and is comfortable with current expander size.   Reviewed fat grafting to aid with contour, thickening flaps and possibly may benefit radiated tissue changes. Discussed donor site pain, compression garment. Discussed risks fat  necrosis which may present a mass, cysts, calcifications; variable take graft and may need to repeat. She desires to proceed with this. Asked that she purchase compression garment.  Plan OP surgery, do not anticipate drains.  Natrelle 133MX-12-T 400 ml tissue expanders placed,  405 ml total fill volume.  405 ml total fill volume  Irene Limbo, MD [facility] Plastic & Reconstructive Surgery [phone], pin [phone]

## 2017-06-29 NOTE — Anesthesia Postprocedure Evaluation (Signed)
Anesthesia Post Note  Patient: Anita Frazier  Procedure(s) Performed: REMOVAL OF BILATERAL TISSUE EXPANDERS WITH PLACEMENT OF BILATERAL BREAST IMPLANTS (Bilateral Breast) LIPOFILLING FROM ABDOMEN TO BILATERAL CHEST (Bilateral Abdomen)     Patient location during evaluation: PACU Anesthesia Type: General Level of consciousness: awake Vital Signs Assessment: post-procedure vital signs reviewed and stable Respiratory status: spontaneous breathing Cardiovascular status: stable Anesthetic complications: no    Last Vitals:  Vitals:   06/29/17 1030 06/29/17 1104  BP: 132/80 127/70  Pulse: 71 69  Resp: (!) 8 18  Temp:  (!) 36.4 C  SpO2: 93% 97%    Last Pain:  Vitals:   06/29/17 1104  TempSrc:   PainSc: 4                  Darrow Barreiro

## 2017-06-29 NOTE — Anesthesia Postprocedure Evaluation (Signed)
Anesthesia Post Note  Patient: Anita Frazier  Procedure(s) Performed: REMOVAL OF BILATERAL TISSUE EXPANDERS WITH PLACEMENT OF BILATERAL BREAST IMPLANTS (Bilateral Breast) LIPOFILLING FROM ABDOMEN TO BILATERAL CHEST (Bilateral Abdomen)     Patient location during evaluation: PACU Anesthesia Type: General Level of consciousness: awake Pain management: pain level controlled Vital Signs Assessment: post-procedure vital signs reviewed and stable Respiratory status: spontaneous breathing Cardiovascular status: stable Anesthetic complications: no    Last Vitals:  Vitals:   06/29/17 1030 06/29/17 1104  BP: 132/80 127/70  Pulse: 71 69  Resp: (!) 8 18  Temp:  (!) 36.4 C  SpO2: 93% 97%    Last Pain:  Vitals:   06/29/17 1104  TempSrc:   PainSc: 4                  Jaynee Winters

## 2017-06-29 NOTE — Anesthesia Procedure Notes (Signed)
Procedure Name: Intubation Date/Time: 06/29/2017 7:37 AM Performed by: Hewitt Blade, CRNA Pre-anesthesia Checklist: Patient identified, Emergency Drugs available, Suction available and Patient being monitored Patient Re-evaluated:Patient Re-evaluated prior to induction Oxygen Delivery Method: Circle system utilized Preoxygenation: Pre-oxygenation with 100% oxygen Induction Type: IV induction Ventilation: Mask ventilation without difficulty Laryngoscope Size: Mac and 3 Grade View: Grade I Tube type: Oral Tube size: 7.0 mm Number of attempts: 1 Airway Equipment and Method: Stylet Placement Confirmation: ETT inserted through vocal cords under direct vision,  positive ETCO2 and breath sounds checked- equal and bilateral Secured at: 21 cm Tube secured with: Tape Dental Injury: Teeth and Oropharynx as per pre-operative assessment

## 2017-06-29 NOTE — Discharge Instructions (Signed)

## 2017-06-29 NOTE — Anesthesia Preprocedure Evaluation (Signed)
Anesthesia Evaluation  Patient identified by MRN, date of birth, ID band Patient awake    Reviewed: Allergy & Precautions, NPO status , Patient's Chart, lab work & pertinent test results  Airway Mallampati: II  TM Distance: >3 FB     Dental   Pulmonary    breath sounds clear to auscultation       Cardiovascular negative cardio ROS   Rhythm:Regular Rate:Normal     Neuro/Psych    GI/Hepatic negative GI ROS, Neg liver ROS,   Endo/Other  negative endocrine ROS  Renal/GU negative Renal ROS     Musculoskeletal   Abdominal   Peds  Hematology   Anesthesia Other Findings   Reproductive/Obstetrics                             Anesthesia Physical Anesthesia Plan  ASA: II  Anesthesia Plan: General   Post-op Pain Management:    Induction: Intravenous  PONV Risk Score and Plan: 3 and Treatment may vary due to age or medical condition, Ondansetron and Dexamethasone  Airway Management Planned:   Additional Equipment:   Intra-op Plan:   Post-operative Plan: Extubation in OR  Informed Consent:   Plan Discussed with:   Anesthesia Plan Comments:         Anesthesia Quick Evaluation

## 2017-06-29 NOTE — Op Note (Signed)
Operative Note   DATE OF OPERATION: 12.21.18  LOCATION: [facility]-outpatient  SURGICAL DIVISION: Plastic Surgery  PREOPERATIVE DIAGNOSES:  1. History breast cancer 2. Acquired absence breasts  POSTOPERATIVE DIAGNOSES:  same  PROCEDURE:  1. Removal bilateral tissue expanders and placement saline implants 2. Lipofilling from abdoment to bilateral chest  SURGEON: Irene Limbo MD MBA  ASSISTANT: none  ANESTHESIA:  General.   EBL: 25 ml  COMPLICATIONS: None immediate.   INDICATIONS FOR PROCEDURE:  The patient, Anita Frazier, is a 51 y.o. female born on [birth year], is here for second stage reconstruction following bilateral skin sparing mastectomies with immediate expander acellular dermis reconstruction.   FINDINGS: Full incorporation ADM bilateral. 50 ml fat infiltrated over each chest. Natrelle Smooth Round High Profile 425 ml implants placed bilateral, fill volume 455 bilateral. REF [id number] RIGHT SN [id number] LEFT [id number]  DESCRIPTION OF PROCEDURE:  The patient's operative site was marked with the patient in the preoperative area to mark sternal notch, chest midline, anterior axillary lines. Supra and infraumbilcal abdomen marked for liposuction. The patient was taken to the operating room. SCDs were placed and IV antibiotics were given. The patient's operative site was prepped and draped in a sterile fashion. A time out was performed and all information was confirmed to be correct. I began on left side. Incision made through prior mastectomy scar and carried to anterior surface pectoralis muscle. Skin flaps elevated off muscle surface in limited fashion and muscle divided in direction of fibers. Expander removed. Capsulotomies performed over lower pole with radial scoring to release tethered skin flap. Skin only excised at medial extent mastectomy scar to address soft tissue fullness in this area.  I then directed attention to right breast and capsule entered in similar  manner. Expander removed and well incorporated ADM noted.  Sizer placed and patient brought to upright sitting position. Natrelle Smooth Round 425 ml High Profile implants selected for bilateral placement. The patient was returned to supine position.  Stab incision made over bilateral abdomen and tumescent fluid infiltrated over supra and infraumbilical abdomen,total 097 ml tumescent infiltrated. Power assisted liposuction performed to endpoint symmetric contour and soft tissue thickness, total lipoaspirate 200 ml. The fat was then washed and prepared by gravity for infiltration. Harvested fat was then infiltrated in subcutaneous plane throughout total envelope mastectomy flaps.   Each cavity irrigated with bacitracin, Ancef, gentamicin solution. Hemostasis ensured. Each cavity then irrigated with Betadine. The implant was placed in right chest and filled to 455 ml. Fill tubing removed and implant orientation and tab closure ensured. Closure completed with 3-0 vicryl to close pectoralis muscle over implant. 3-0 vicryl used to close dermis followed by 4-0 monocryl subcuticular. Implant placed in left chest cavity and filled to 455 ml. Fill tubing removed and implant orientation and tab closure ensured. Closure completed in similar fashion. Abdomen incisions approximated with simple 4-0 monocryl stitch. Tissue adhesive and dry dressing applied, followed by breast and abdominal binders.   The patient was allowed to wake from anesthesia, extubated and taken to the recovery room in satisfactory condition.   SPECIMENS: none  DRAINS: none  Irene Limbo, MD [facility] Plastic & Reconstructive Surgery [phone], pin [phone]

## 2017-06-29 NOTE — Transfer of Care (Signed)
Immediate Anesthesia Transfer of Care Note  Patient: Anita Frazier  Procedure(s) Performed: REMOVAL OF BILATERAL TISSUE EXPANDERS WITH PLACEMENT OF BILATERAL BREAST IMPLANTS (Bilateral Breast) LIPOFILLING FROM ABDOMEN TO BILATERAL CHEST (Bilateral Abdomen)  Patient Location: PACU  Anesthesia Type:General  Level of Consciousness: awake, alert  and oriented  Airway & Oxygen Therapy: Patient Spontanous Breathing and Patient connected to face mask oxygen  Post-op Assessment: Report given to RN, Post -op Vital signs reviewed and stable and Patient moving all extremities  Post vital signs: Reviewed and stable  Last Vitals:  Vitals:   06/29/17 0630  BP: 124/75  Pulse: 64  Resp: 16  Temp: 36.7 C  SpO2: 98%    Last Pain:  Vitals:   06/29/17 0630  TempSrc: Oral      Patients Stated Pain Goal: 0 (93/71/69 [id number])  Complications: No apparent anesthesia complications

## 2017-07-04 ENCOUNTER — Encounter (HOSPITAL_BASED_OUTPATIENT_CLINIC_OR_DEPARTMENT_OTHER): Payer: Self-pay | Admitting: Plastic Surgery

## 2017-08-13 DIAGNOSIS — Z0000 Encounter for general adult medical examination without abnormal findings: Secondary | ICD-10-CM | POA: Diagnosis not present

## 2017-08-13 DIAGNOSIS — E785 Hyperlipidemia, unspecified: Secondary | ICD-10-CM | POA: Diagnosis not present

## 2017-10-25 DIAGNOSIS — Z1211 Encounter for screening for malignant neoplasm of colon: Secondary | ICD-10-CM | POA: Diagnosis not present

## 2017-12-11 DIAGNOSIS — Z124 Encounter for screening for malignant neoplasm of cervix: Secondary | ICD-10-CM | POA: Diagnosis not present

## 2017-12-11 DIAGNOSIS — Z01419 Encounter for gynecological examination (general) (routine) without abnormal findings: Secondary | ICD-10-CM | POA: Diagnosis not present

## 2017-12-11 DIAGNOSIS — Z6821 Body mass index (BMI) 21.0-21.9, adult: Secondary | ICD-10-CM | POA: Diagnosis not present

## 2017-12-20 DIAGNOSIS — Z171 Estrogen receptor negative status [ER-]: Secondary | ICD-10-CM | POA: Diagnosis not present

## 2017-12-20 DIAGNOSIS — C50512 Malignant neoplasm of lower-outer quadrant of left female breast: Secondary | ICD-10-CM | POA: Diagnosis not present

## 2017-12-24 DIAGNOSIS — Z853 Personal history of malignant neoplasm of breast: Secondary | ICD-10-CM | POA: Diagnosis not present

## 2017-12-24 DIAGNOSIS — Z9013 Acquired absence of bilateral breasts and nipples: Secondary | ICD-10-CM | POA: Diagnosis not present

## 2017-12-26 DIAGNOSIS — K5730 Diverticulosis of large intestine without perforation or abscess without bleeding: Secondary | ICD-10-CM | POA: Diagnosis not present

## 2017-12-26 DIAGNOSIS — Z1211 Encounter for screening for malignant neoplasm of colon: Secondary | ICD-10-CM | POA: Diagnosis not present

## 2018-02-27 NOTE — Progress Notes (Signed)
[place]  [phone] [phone]) [phone]     ID: Anita Frazier DOB: 08-19-1965  MR#: [id number]  TDS#:[id number]  Patient Care Team: Delsa Bern, MD as PCP - General (Obstetrics and Gynecology) Kallan Bischoff, Virgie Dad, MD as Consulting Physician (Oncology) Jovita Kussmaul, MD as Consulting Physician (General Surgery) Eppie Gibson, MD as Attending Physician (Radiation Oncology) Irene Limbo, MD as Consulting Physician (Plastic Surgery) Delice Bison, Charlestine Massed, NP as Nurse Practitioner (Hematology and Oncology) Juanita Craver, MD as Consulting Physician (Gastroenterology) OTHER MD:  CHIEF COMPLAINT: Estrogen receptor negative breast cancer  CURRENT TREATMENT: Observation   BREAST CANCER HISTORY: From the original intake note:  Anita Frazier had screening mammography 12/06/2016 at Dr Boyd Kerbs suggesting a possible abnormality in the left breast upper outer quadrant. Left diagnostic mammography at [facility] 12/07/2016 found the breast density to be category D. In the upper-outer quadrant of the left breast there was 4.3 cm area of pleomorphic calcifications. Left breast ultrasonography 12/11/2016 found a mass in the area in question measuring 4.5 cm. The left axilla was sonographically benign.  Biopsy of the left breast upper outer quadrant mass 12/11/2016 showed ([id number]) ductal carcinoma in situ, high-grade, with areas highly suspicious for invasion. There are 2 prognstic panels, both estrogen and progesterone receptor negative, with MIB-1 between 10 and 15%. The tumor was HER-2 amplified, with a signals ratio 4.25 and the number per cell 9.35.  The patient's subsequent history is as detailed below.  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 She continues under observation.   Since her last visit here she completed her reconstruction.  She recovered well with her reconstruction. She  is not planning on having tattooed nipples, instead she may do sometimes more artistic in the future.   She has had some minimal discomfort in the breast.   In addition, she underwent colonoscopy under Dr. Collene Mares in December 2018, reportedly with no findings of concern.   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 She completed a colonoscopy under Dr. Collene Mares this summer. For exercise she runs on the trails. She denies unusual headaches, visual changes, nausea, vomiting, or dizziness. There has been no unusual cough, phlegm production, or pleurisy. There has been no change in bowel or bladder habits. She denies unexplained fatigue or unexplained weight loss, bleeding, rash, or fever. A detailed review of systems was otherwise stable.    PAST MEDICAL HISTORY: Past Medical History:  Diagnosis Date  . Abnormal Pap smear 1992 & 3/99   LEEP  . ASCUS (atypical squamous cells of undetermined significance) on Pap smear 08/1997  . Breast cancer ([place])   . 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 Dysplasia of cervix, low grade (CIN 1)   . Factor 5 Leiden mutation, heterozygous ([place]) 06/09/04  . Family history of breast cancer   . Irregular menses 01/2001  . Menstrual period late 11/17/10  . Pelvic pain 12/05/06    PAST SURGICAL HISTORY: Past Surgical History:  Procedure Laterality Date  . ARTHROSCOPIC REPAIR ACL Left 2008  . BREAST RECONSTRUCTION WITH PLACEMENT OF TISSUE EXPANDER AND FLEX HD (ACELLULAR HYDRATED DERMIS) Bilateral 02/15/2017   Procedure: BILATERAL BREAST RECONSTRUCTION WITH PLACEMENT OF TISSUE EXPANDERS AND ALLODERM;  Surgeon: Irene Limbo, MD;  Location: [place];  Service: Plastics;  Laterality: Bilateral;  . LEEP  1992  . LIPOSUCTION WITH LIPOFILLING Bilateral 06/29/2017   Procedure: LIPOFILLING FROM  ABDOMEN TO BILATERAL CHEST;  Surgeon: Irene Limbo, MD;  Location: [place];  Service: Plastics;  Laterality: Bilateral;  . MASTECTOMY W/ SENTINEL NODE BIOPSY Bilateral 02/15/2017   Procedure: BILATERAL SKIN SPARING MASTECTOMIES WITH LEFT AXILLARY SENTINEL LYMPH NODE BIOPSY;  Surgeon: Jovita Kussmaul, MD;  Location: [place];  Service: General;  Laterality: Bilateral;  . REMOVAL OF BILATERAL TISSUE EXPANDERS WITH PLACEMENT OF BILATERAL BREAST IMPLANTS Bilateral 06/29/2017   Procedure: REMOVAL OF BILATERAL TISSUE EXPANDERS WITH PLACEMENT OF BILATERAL BREAST IMPLANTS;  Surgeon: Irene Limbo, MD;  Location: [place];  Service: Plastics;  Laterality: Bilateral;    FAMILY HISTORY Family History  Problem Relation Age of Onset  . Heart disease Father 64       MI  . Breast cancer Maternal Aunt 24       now is 53  . Hypertension Mother        chronic  . COPD Son        Respiratory affective disease   . Diabetes Maternal Grandmother   . Cancer Maternal Grandmother 27       type unknown  . Breast cancer Maternal Aunt 70       now is 24  The patient's parents are living as of June 2018, her father being 35 and her mother 73. The patient's mother is 55 sisters, 2 of whom had breast cancer, one at age 39, one at age 37. The patient had no brothers, 1 sister. There is no history of ovarian cancer in the family.  GYNECOLOGIC HISTORY:  Patient's last menstrual period was 02/14/2012. Menarche age 62, first live birth age 46, she is GX P3, including a per of twins). She stopped having periods approximately 2015. She did not use hormone replacement. She used oral contraceptives for 2 years remotely with no complications but no history of factor V Leiden  SOCIAL HISTORY:  Marcha works as an Nurse, children's. Her husband, Palma Holter who works in [employer] for [employer]. Their children are:, 20, studying business at [employer] and still at home, and the twins [place] and [place] Ma , 18, who will be freshman at [employer] August  2018. The patient attends a local [place]:  in place. The patient's husband is her healthcare part of attorney    HEALTH MAINTENANCE: Social History   Tobacco Use  . Smoking status: Never Smoker  . Smokeless tobacco: Never Used  Substance Use Topics  . Alcohol use: Yes    Comment: 2-3 drinks weekly  . Drug use: No     Colonoscopy:Never  PAP:  Bone density:   No Known Allergies  Current Outpatient Medications  Medication Sig Dispense Refill  . HYDROcodone-acetaminophen (NORCO) 5-325 MG tablet Take 1 tablet by mouth every 4 (four) hours as needed for moderate pain. 20 tablet 0  . sulfamethoxazole-trimethoprim (BACTRIM DS,SEPTRA DS) 800-160 MG tablet Take 1 tablet by mouth 2 (two) times daily. 14 tablet 0   No current facility-administered medications for this visit.     OBJECTIVE: Middle-aged white woman who appears well  Vitals:   02/28/18 1440  BP: 137/83  Pulse: (!) 55  Resp: 18  Temp: 98.9 F (37.2 C)  SpO2: 100%     Body mass index is 22.34 kg/m.  Filed Weights   02/28/18 1440  Weight: 114 lb 6.4 oz (51.9 kg)      ECOG FS:0 - Asymptomatic  Sclerae unicteric, EOMs intact Oropharynx  clear and moist No cervical or supraclavicular adenopathy Lungs no rales or rhonchi Heart regular rate and rhythm Abd soft, nontender, positive bowel sounds MSK no focal spinal tenderness, no upper extremity lymphedema Neuro: nonfocal, well oriented, appropriate affect Breasts: Status post bilateral mastectomies with bilateral implant reconstruction.  The result is very symmetrical.  There is no evidence of chest wall recurrence.  Both axillae are benign.  LAB RESULTS:  CMP     Component Value Date/Time   NA 143 01/08/2017 1524   K 4.1 01/08/2017 1524   CO2 28 01/08/2017 1524   GLUCOSE 99 01/08/2017 1524   BUN 14.5 01/08/2017 1524   CREATININE 0.8 01/08/2017 1524   CALCIUM 10.3 01/08/2017 1524   PROT 7.1 01/08/2017 1524   ALBUMIN 4.1  01/08/2017 1524   AST 20 01/08/2017 1524   ALT 17 01/08/2017 1524   ALKPHOS 42 01/08/2017 1524   BILITOT 0.97 01/08/2017 1524    No results found for: TOTALPROTELP, ALBUMINELP, A1GS, A2GS, BETS, BETA2SER, GAMS, MSPIKE, SPEI  No results found for: Nils Pyle, [facility]  Lab Results  Component Value Date   WBC 7.7 01/08/2017   NEUTROABS 5.7 01/08/2017   HGB 13.2 02/15/2017   HCT 42.3 01/08/2017   MCV 95.4 01/08/2017   PLT 185 01/08/2017      Chemistry      Component Value Date/Time   NA 143 01/08/2017 1524   K 4.1 01/08/2017 1524   CO2 28 01/08/2017 1524   BUN 14.5 01/08/2017 1524   CREATININE 0.8 01/08/2017 1524      Component Value Date/Time   CALCIUM 10.3 01/08/2017 1524   ALKPHOS 42 01/08/2017 1524   AST 20 01/08/2017 1524   ALT 17 01/08/2017 1524   BILITOT 0.97 01/08/2017 1524       No results found for: LABCA2  No components found for: [id number]  No results for input(s): INR in the last 168 hours.  Urinalysis No results found for: COLORURINE, APPEARANCEUR, LABSPEC, PHURINE, GLUCOSEU, HGBUR, BILIRUBINUR, KETONESUR, PROTEINUR, UROBILINOGEN, NITRITE, LEUKOCYTESUR   STUDIES: No results found.  ELIGIBLE FOR AVAILABLE RESEARCH PROTOCOL: no  ASSESSMENT: 52 y.o. 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 (See discussion below)  (2) status post bilateral mastectomies, showing  (a) on the right, no evidence of disease  (b) on the left, ductal carcinoma in situ, extensive, with negative margins. There was a microinvasive ductal carcinoma, less than a millimeter., final pathology  pT1)mic) pN0  (3) No additional breast cancer risk reduction therapy needed  (4) she is heterozygous for factor V Leiden mutation.  (5) breast construction: Status post saline implant exchange (Natrelle Smooth Round High Profile 425 ml) and lipophilling.   PLAN: Ishitha has done a great job at addressing the concerns regarding her breast cancer.  She understands that from the day of her surgery she was very likely cured.  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  She is very satisfied with the results.  At this point she is not considering nipple tattooing.  With regards to her mutation, she understands we do not have enough data to give her numeric risk assessments, but in general it would  probably be prudent to have a colonoscopy every 5 years until she is 60.  Certainly is more information on this particular mutation comes in those recommendations may change.  At this point I am comfortable releasing her to her primary care physicians care.  All she will need in terms of breast cancer follow-up is a yearly physician breast exam.  Accordingly I am not making her a return appointment here, but I will be glad to see her again at any point in the future if and when the need arises.   Biddie Sebek, Virgie Dad, MD  02/28/18 2:54 PM Medical Oncology and Hematology [facility] [address] [place], [place] [address] Tel. [phone]    Fax. [phone]  Alice Rieger, am acting as scribe for Chauncey Cruel MD.  I, Lurline Del MD, have reviewed the above documentation for accuracy and completeness, and I agree with the above.

## 2018-02-28 ENCOUNTER — Inpatient Hospital Stay: Payer: 59 | Attending: Oncology | Admitting: Oncology

## 2018-02-28 VITALS — BP 137/83 | HR 55 | Temp 98.9°F | Resp 18 | Ht 60.0 in | Wt 114.4 lb

## 2018-02-28 DIAGNOSIS — Z171 Estrogen receptor negative status [ER-]: Secondary | ICD-10-CM

## 2018-02-28 DIAGNOSIS — C50412 Malignant neoplasm of upper-outer quadrant of left female breast: Secondary | ICD-10-CM

## 2018-02-28 DIAGNOSIS — D6851 Activated protein C resistance: Secondary | ICD-10-CM | POA: Insufficient documentation

## 2018-02-28 DIAGNOSIS — Z86000 Personal history of in-situ neoplasm of breast: Secondary | ICD-10-CM | POA: Diagnosis not present

## 2018-02-28 DIAGNOSIS — D0512 Intraductal carcinoma in situ of left breast: Secondary | ICD-10-CM

## 2018-03-01 ENCOUNTER — Telehealth: Payer: Self-pay | Admitting: Oncology

## 2018-03-01 NOTE — Telephone Encounter (Signed)
Per 8/22 no los

## 2018-06-26 DIAGNOSIS — C50512 Malignant neoplasm of lower-outer quadrant of left female breast: Secondary | ICD-10-CM | POA: Diagnosis not present

## 2018-06-26 DIAGNOSIS — Z171 Estrogen receptor negative status [ER-]: Secondary | ICD-10-CM | POA: Diagnosis not present

## 2018-08-21 DIAGNOSIS — Z79899 Other long term (current) drug therapy: Secondary | ICD-10-CM | POA: Diagnosis not present

## 2018-08-21 DIAGNOSIS — E785 Hyperlipidemia, unspecified: Secondary | ICD-10-CM | POA: Diagnosis not present

## 2018-09-09 DIAGNOSIS — Z0000 Encounter for general adult medical examination without abnormal findings: Secondary | ICD-10-CM | POA: Diagnosis not present

## 2020-09-06 ENCOUNTER — Other Ambulatory Visit: Payer: Self-pay | Admitting: General Surgery

## 2020-09-06 DIAGNOSIS — Z170 Estrogen receptor positive status [ER+]: Secondary | ICD-10-CM

## 2020-09-06 DIAGNOSIS — C50512 Malignant neoplasm of lower-outer quadrant of left female breast: Secondary | ICD-10-CM

## 2020-09-16 ENCOUNTER — Ambulatory Visit
Admission: RE | Admit: 2020-09-16 | Discharge: 2020-09-16 | Disposition: A | Discharge status: Home / Self Care | Payer: 59 | Source: Ambulatory Visit | Attending: General Surgery | Admitting: General Surgery

## 2020-09-16 DIAGNOSIS — C50512 Malignant neoplasm of lower-outer quadrant of left female breast: Secondary | ICD-10-CM

## 2021-11-08 IMAGING — CT CT T SPINE W/O CM
3 of 5 series · 9 of 33 positions shown, 10 images · non-contrast
Comparison: None.

CLINICAL DATA: 55-year-old female with back pain for 2 months.
History of breast cancer, bilateral mastectomy.

EXAM:
CT THORACIC SPINE WITHOUT CONTRAST
TECHNIQUE: Multidetector CT images of the thoracic were obtained using the
standard protocol without intravenous contrast.

[Series 5: t-spine 2.00 br60 s3 · axial · 0.28mm/px · z∈[-908,-908]mm · 1 of 153 slices shown, 2 images (1 of 3)]
[im 77/153  soft-tissue]
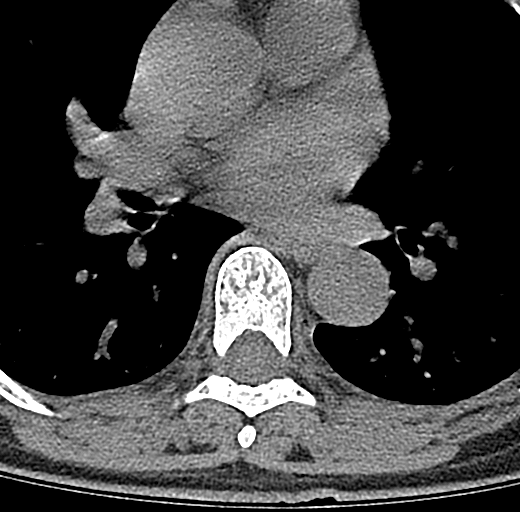
[im 77/153  bone]
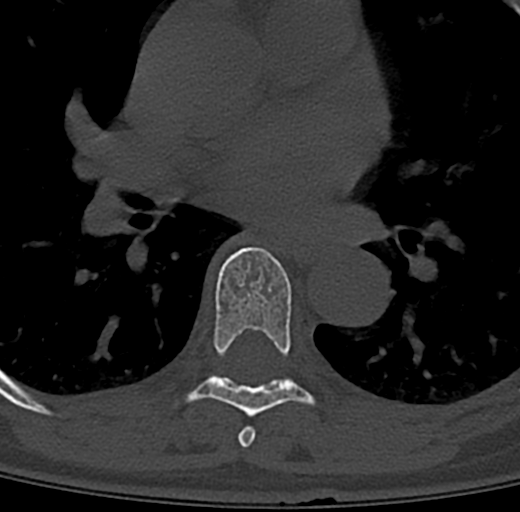

[Series 7: t-spine 2.00 br60 s3 · sagittal · 0.28mm/px · 5 of 71 slices shown (2 of 3)]
[im 24/71  bone]
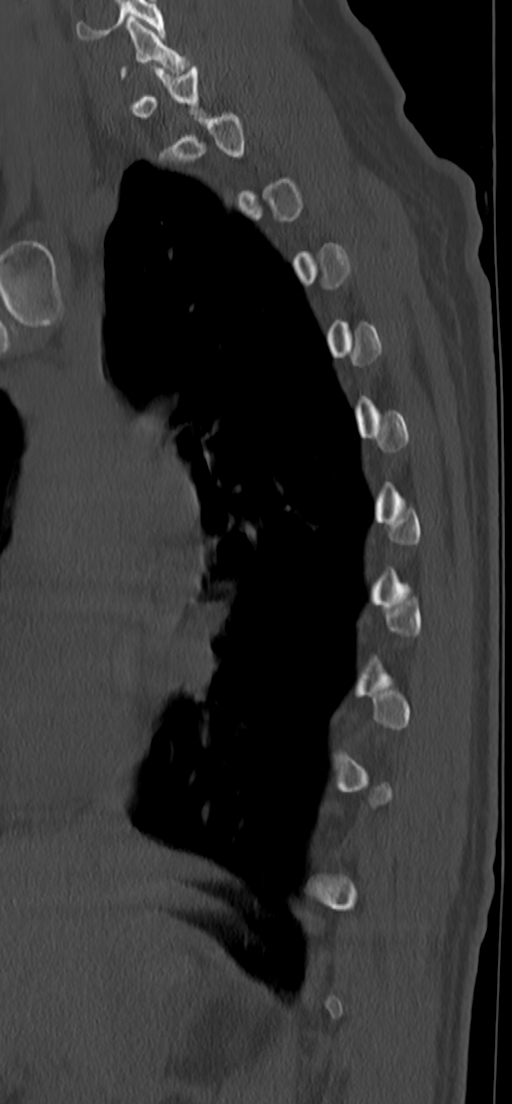
[im 30/71  bone]
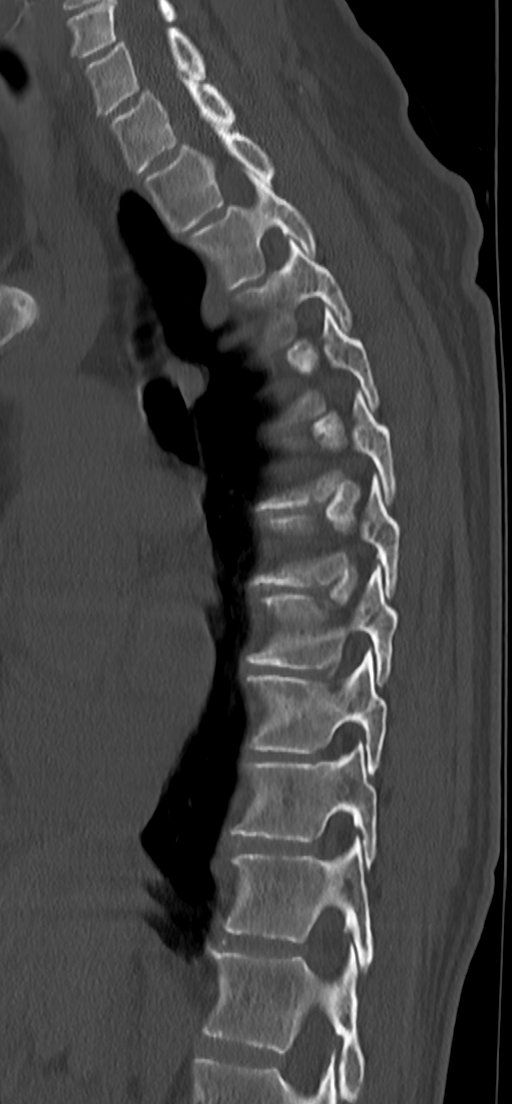
[im 36/71  bone]
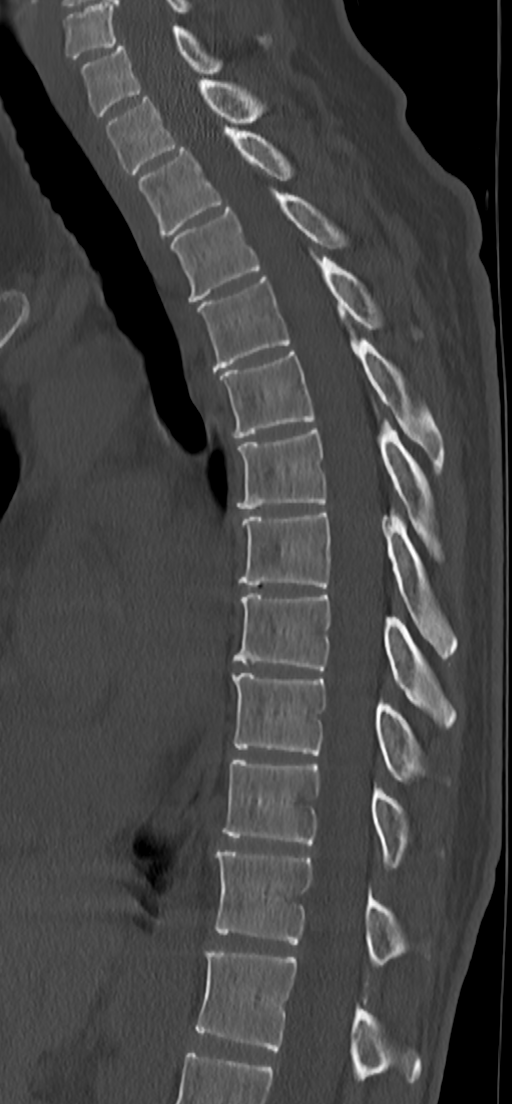
[im 41/71  bone]
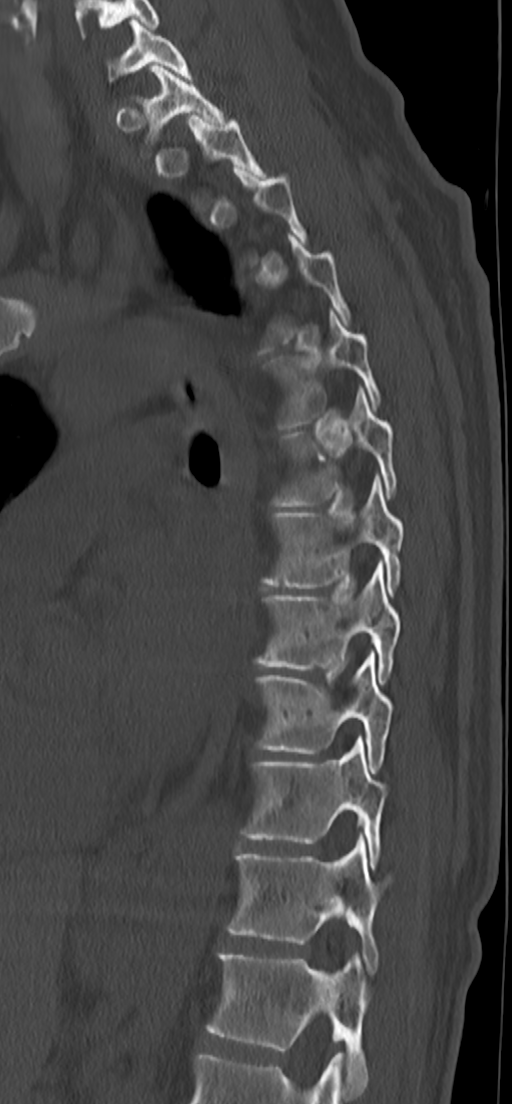
[im 47/71  bone]
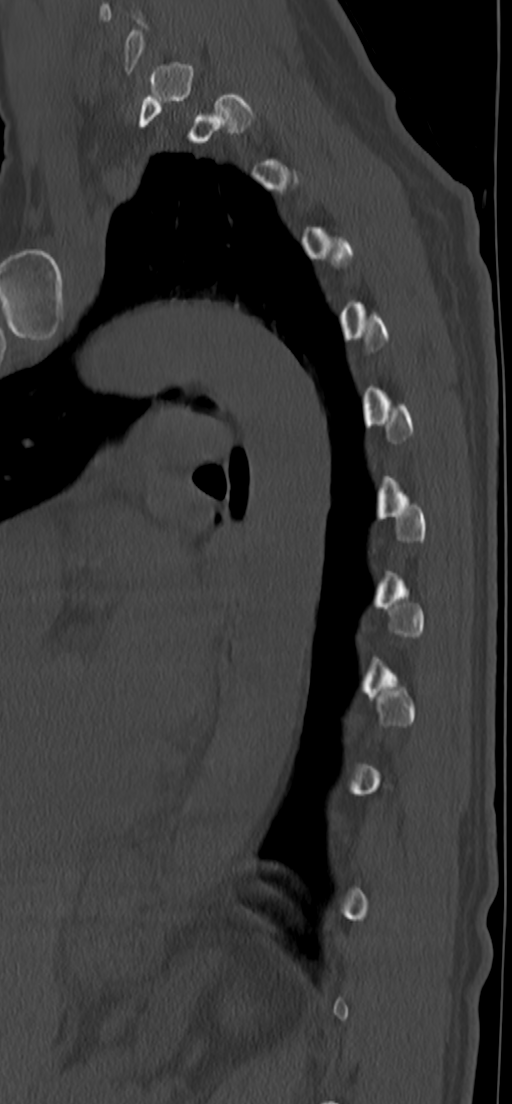

[Series 9: t-spine 2.00 br60 s3 · coronal · 0.28mm/px · 3 of 71 slices shown (3 of 3)]
[im 15/71  bone]
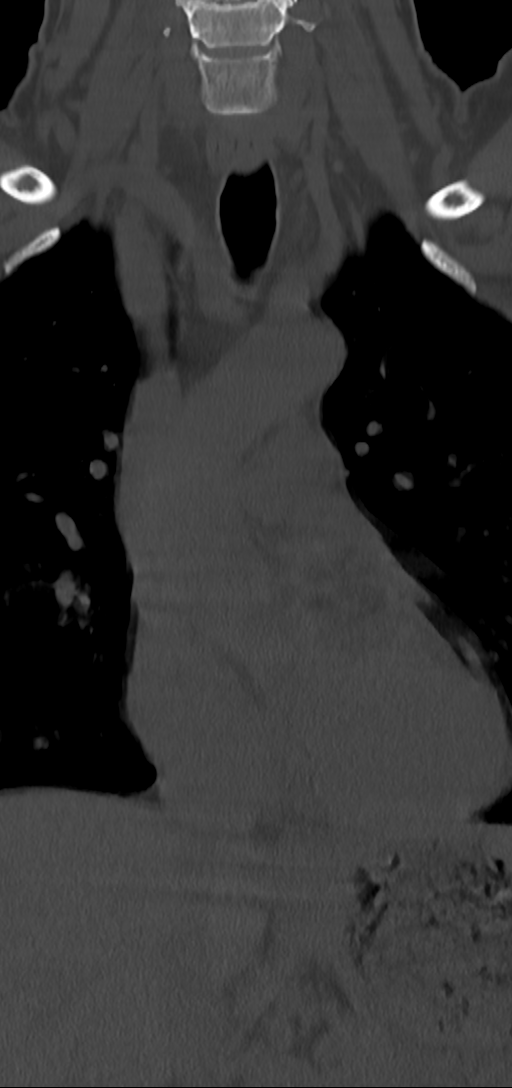
[im 29/71  bone]
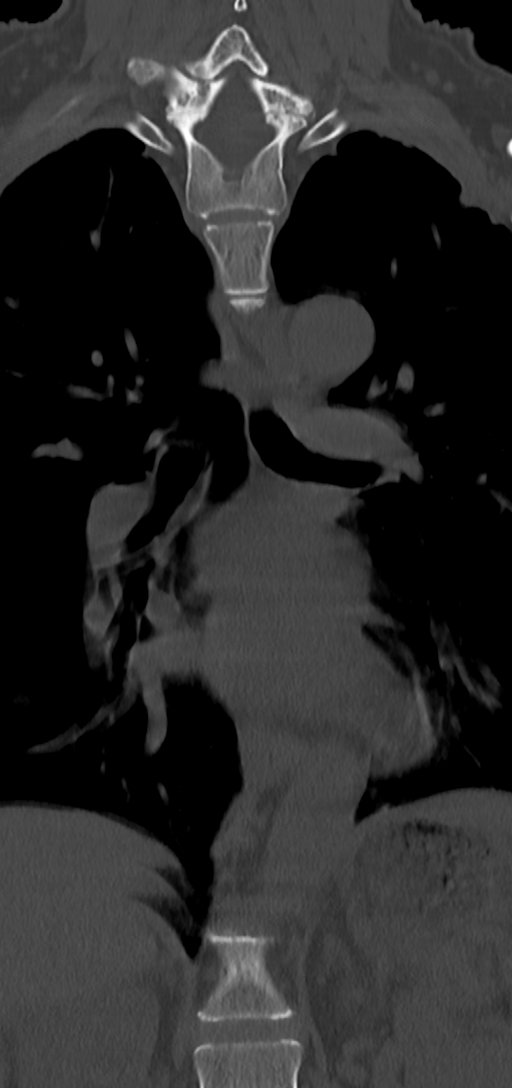
[im 43/71  bone]
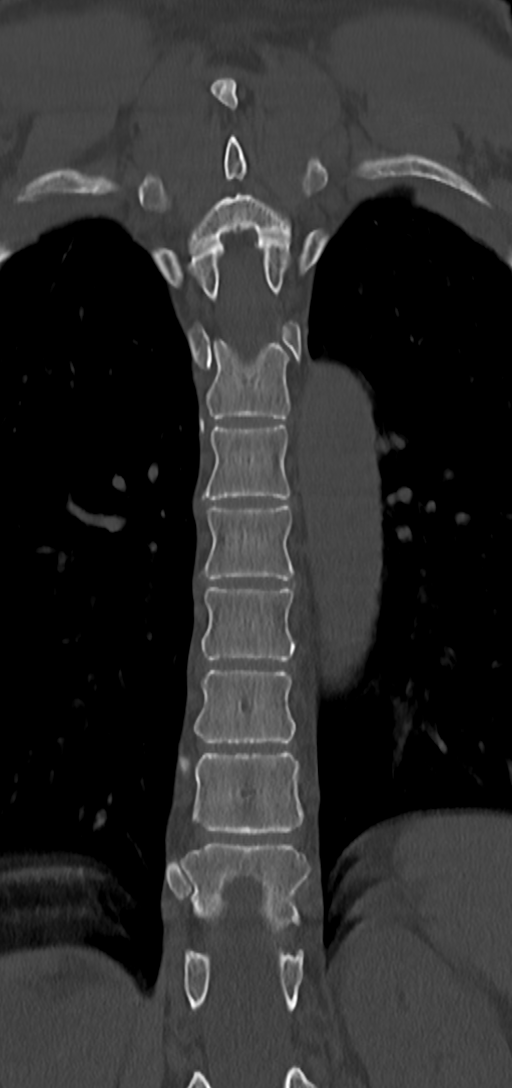

[9 of 33 positions shown; findings below may reference images not displayed]

FINDINGS: Limited cervical spine imaging: Subtle degenerative appearing
anterolisthesis of C6 on C7. Mild lower cervical spine facet
hypertrophy which continues to the cervicothoracic junction greater
on the right. Mild C6-C7 disc bulging.

Thoracic spine segmentation: Hypoplastic ribs at T12. Otherwise
normal.

Alignment: Normal thoracic kyphosis. No thoracic spondylolisthesis.

Vertebrae: Thoracic bone mineralization is within normal limits. No
thoracic vertebral osseous abnormality identified. Visible posterior
ribs appear intact.

Paraspinal and other soft tissues:

Negative visible noncontrast visible thoracic inlet, chest, and
upper abdominal viscera; a 3.5 cm right upper pole renal cyst with
simple fluid density appears benign and inconsequential.

Negative thoracic paraspinal soft tissues.

Disc levels: Capacious thoracic spinal canal.

T1-T2: Mild facet hypertrophy greater on the left.  No stenosis.

T2-T3: Negative.

T3-T4: Mild calcified ligament flavum hypertrophy. Otherwise
negative.

T4-T5: Mild calcified ligament flavum hypertrophy. Otherwise
negative.

T5-T6: Negative.

T6-T7: Negative.

T7-T8: Negative.

T8-T9: Mild calcified ligament flavum hypertrophy on the left.
Associated mild left T8 foraminal stenosis. Otherwise negative.

T9-T10: Negative.

T10-T11: Mild circumferential disc bulge. No stenosis and otherwise
negative.

T11-T12: Minimal circumferential disc bulge.  Otherwise negative.

T12-L1: Negative.
IMPRESSION: 1. No acute osseous abnormality in the thoracic spine.

2. Capacious thoracic spinal canal with mild thoracic degeneration,
mostly ligament flavum hypertrophy. No thoracic spinal stenosis.
Isolated mild left T8 foraminal stenosis.

## 2022-02-13 DIAGNOSIS — Z853 Personal history of malignant neoplasm of breast: Secondary | ICD-10-CM | POA: Diagnosis not present

## 2022-02-13 DIAGNOSIS — D6851 Activated protein C resistance: Secondary | ICD-10-CM | POA: Diagnosis not present

## 2022-02-13 DIAGNOSIS — Z9013 Acquired absence of bilateral breasts and nipples: Secondary | ICD-10-CM | POA: Diagnosis not present

## 2022-02-13 DIAGNOSIS — E785 Hyperlipidemia, unspecified: Secondary | ICD-10-CM | POA: Diagnosis not present

## 2022-02-13 DIAGNOSIS — Z23 Encounter for immunization: Secondary | ICD-10-CM | POA: Diagnosis not present

## 2022-02-13 DIAGNOSIS — Z0000 Encounter for general adult medical examination without abnormal findings: Secondary | ICD-10-CM | POA: Diagnosis not present

## 2022-03-27 DIAGNOSIS — L738 Other specified follicular disorders: Secondary | ICD-10-CM | POA: Diagnosis not present

## 2022-03-27 DIAGNOSIS — D485 Neoplasm of uncertain behavior of skin: Secondary | ICD-10-CM | POA: Diagnosis not present

## 2022-03-27 DIAGNOSIS — D1801 Hemangioma of skin and subcutaneous tissue: Secondary | ICD-10-CM | POA: Diagnosis not present

## 2022-03-27 DIAGNOSIS — L821 Other seborrheic keratosis: Secondary | ICD-10-CM | POA: Diagnosis not present

## 2022-03-27 DIAGNOSIS — D225 Melanocytic nevi of trunk: Secondary | ICD-10-CM | POA: Diagnosis not present
